# Patient Record
Sex: Female | Born: 1985 | Race: Asian | Hispanic: No | Marital: Married | Smoking: Never smoker
Health system: Southern US, Community
[De-identification: ages and names within clinical notes are randomized; demographics above are authoritative.]

## PROBLEM LIST (undated history)

## (undated) DIAGNOSIS — D582 Other hemoglobinopathies: Secondary | ICD-10-CM

## (undated) DIAGNOSIS — Z8742 Personal history of other diseases of the female genital tract: Secondary | ICD-10-CM

## (undated) DIAGNOSIS — R200 Anesthesia of skin: Secondary | ICD-10-CM

## (undated) DIAGNOSIS — Z8739 Personal history of other diseases of the musculoskeletal system and connective tissue: Secondary | ICD-10-CM

## (undated) DIAGNOSIS — L900 Lichen sclerosus et atrophicus: Secondary | ICD-10-CM

## (undated) HISTORY — PX: NO PAST SURGERIES: SHX2092

## (undated) HISTORY — DX: Other hemoglobinopathies: D58.2

## (undated) HISTORY — PX: WISDOM TOOTH EXTRACTION: SHX21

## (undated) HISTORY — DX: Personal history of other diseases of the female genital tract: Z87.42

## (undated) HISTORY — DX: Lichen sclerosus et atrophicus: L90.0

---

## 2006-01-20 ENCOUNTER — Emergency Department (HOSPITAL_COMMUNITY): Admission: EM | Admit: 2006-01-20 | Discharge: 2006-01-20 | Payer: Self-pay | Admitting: Emergency Medicine

## 2007-11-01 ENCOUNTER — Emergency Department (HOSPITAL_COMMUNITY): Admission: EM | Admit: 2007-11-01 | Discharge: 2007-11-01 | Payer: Self-pay | Admitting: Emergency Medicine

## 2007-11-03 ENCOUNTER — Emergency Department (HOSPITAL_COMMUNITY): Admission: EM | Admit: 2007-11-03 | Discharge: 2007-11-03 | Payer: Self-pay | Admitting: Emergency Medicine

## 2007-11-28 ENCOUNTER — Ambulatory Visit: Payer: Self-pay | Admitting: Gynecology

## 2007-12-08 ENCOUNTER — Ambulatory Visit: Payer: Self-pay | Admitting: Gynecology

## 2008-01-18 ENCOUNTER — Emergency Department (HOSPITAL_COMMUNITY): Admission: EM | Admit: 2008-01-18 | Discharge: 2008-01-18 | Payer: Self-pay | Admitting: Family Medicine

## 2010-04-11 ENCOUNTER — Emergency Department (HOSPITAL_COMMUNITY)
Admission: EM | Admit: 2010-04-11 | Discharge: 2010-04-12 | Disposition: A | Discharge status: Home / Self Care | Payer: BC Managed Care – PPO | Attending: Emergency Medicine | Admitting: Emergency Medicine

## 2010-04-11 DIAGNOSIS — N83209 Unspecified ovarian cyst, unspecified side: Secondary | ICD-10-CM | POA: Insufficient documentation

## 2010-04-11 DIAGNOSIS — R1031 Right lower quadrant pain: Secondary | ICD-10-CM | POA: Insufficient documentation

## 2010-04-11 DIAGNOSIS — D72829 Elevated white blood cell count, unspecified: Secondary | ICD-10-CM | POA: Insufficient documentation

## 2010-04-11 LAB — CBC
Hemoglobin: 10.9 g/dL — ABNORMAL LOW (ref 12.0–15.0)
MCHC: 33.4 g/dL (ref 30.0–36.0)
MCV: 61.7 fL — ABNORMAL LOW (ref 78.0–100.0)
RDW: 15 % (ref 11.5–15.5)

## 2010-04-12 ENCOUNTER — Emergency Department (HOSPITAL_COMMUNITY): Payer: BC Managed Care – PPO

## 2010-04-12 ENCOUNTER — Encounter (HOSPITAL_COMMUNITY): Payer: Self-pay

## 2010-04-12 LAB — BASIC METABOLIC PANEL
Chloride: 102 mEq/L (ref 96–112)
GFR calc Af Amer: >60 mL/min (ref >60)
Glucose, Bld: 96 mg/dL (ref 70–99)
Sodium: 136 mEq/L (ref 135–145)

## 2010-04-12 LAB — WET PREP, GENITAL
Clue Cells Wet Prep HPF POC: NONE SEEN
Trich, Wet Prep: NONE SEEN
Yeast Wet Prep HPF POC: NONE SEEN

## 2010-04-12 LAB — DIFFERENTIAL
Basophils Absolute: 0 10*3/uL (ref 0.0–0.1)
Eosinophils Relative: 0 % (ref 0–5)
Monocytes Relative: 9 % (ref 3–12)

## 2010-04-12 LAB — URINALYSIS, ROUTINE W REFLEX MICROSCOPIC
Bilirubin Urine: NEGATIVE
Ketones, ur: 15 mg/dL — AB
Nitrite: NEGATIVE
Protein, ur: NEGATIVE mg/dL
Specific Gravity, Urine: 1.02 (ref 1.005–1.030)
pH: 6 (ref 5.0–8.0)

## 2010-04-12 LAB — GC/CHLAMYDIA PROBE AMP, GENITAL
Chlamydia, DNA Probe: NEGATIVE
GC Probe Amp, Genital: NEGATIVE

## 2010-04-12 LAB — POCT PREGNANCY, URINE: Preg Test, Ur: NEGATIVE

## 2010-04-12 MED ORDER — IOHEXOL 300 MG/ML  SOLN
100.0000 mL | Freq: Once | INTRAMUSCULAR | Status: AC | PRN
  Administered 2010-04-12: 100 mL via INTRAVENOUS

## 2010-04-13 LAB — URINE CULTURE: Culture  Setup Time: 201202220420

## 2010-10-30 LAB — HIV ANTIBODY (ROUTINE TESTING W REFLEX): HIV: NONREACTIVE

## 2010-10-30 LAB — GC/CHLAMYDIA PROBE AMP, GENITAL
Chlamydia: NEGATIVE
Gonorrhea: NEGATIVE

## 2010-10-30 LAB — RPR: RPR: NONREACTIVE

## 2010-10-30 LAB — ANTIBODY SCREEN: Antibody Screen: NEGATIVE

## 2010-10-30 LAB — HEPATITIS B SURFACE ANTIGEN: Hepatitis B Surface Ag: NEGATIVE

## 2010-11-22 LAB — POCT URINALYSIS DIP (DEVICE)
Bilirubin Urine: NEGATIVE
Glucose, UA: NEGATIVE
Protein, ur: 30 — AB
Protein, ur: NEGATIVE
Specific Gravity, Urine: 1.015
Urobilinogen, UA: 0.2

## 2010-11-22 LAB — URINE CULTURE: Colony Count: >=100000

## 2010-11-22 LAB — POCT PREGNANCY, URINE: Preg Test, Ur: NEGATIVE

## 2011-02-20 NOTE — L&D Delivery Note (Signed)
Operative Delivery Note   Patient pushed over two hours and became exhausted.  Requested assistance and we discussed vacuum assisted delivery in detail as below.  Kiwi vacuum applied to a +3 station with a good amount of head visible at introitus with pushing.  In the green zone, head pulled to crowning with no pop-offs in 3 pulls.  Mother then pushed out remainder of baby. At 6:24 AM a healthy female was delivered via Vaginal, Vacuum Investment banker, operational).  Presentation: vertex; Position: Left,, Occiput,, Anterior; Station: +3.  Verbal consent: obtained from patient.  Risks and benefits discussed in detail.  Risks include, but are not limited to the risks of anesthesia, bleeding, infection, damage to maternal tissues, fetal cephalhematoma.  There is also the risk of inability to effect vaginal delivery of the head, or shoulder dystocia that cannot be resolved by established maneuvers, leading to the need for emergency cesarean section.  APGAR: 8, 8; weight 7 lb 6.3 oz (3355 g).   Placenta status: Intact, Spontaneous.   Cord: 3 vessels with the following complications: None.   Anesthesia: Epidural  Instruments: Kiwi vacuum Episiotomy: None Lacerations: 2nd degree Suture Repair: 3.0 vicryl rapide and 2-0 vicryl to reinforce sphincter Est. Blood Loss (mL): 450 cc  Mom to postpartum.  Baby to nursery-stable.  Oliver Pila 05/18/2011, 7:16 AM

## 2011-05-15 ENCOUNTER — Institutional Professional Consult (permissible substitution): Payer: BC Managed Care – PPO | Admitting: Pediatrics

## 2011-05-16 ENCOUNTER — Telehealth (HOSPITAL_COMMUNITY): Payer: Self-pay | Admitting: *Deleted

## 2011-05-16 ENCOUNTER — Encounter (HOSPITAL_COMMUNITY): Payer: Self-pay | Admitting: *Deleted

## 2011-05-16 NOTE — Telephone Encounter (Signed)
Preadmission screen  

## 2011-05-17 ENCOUNTER — Encounter (HOSPITAL_COMMUNITY): Payer: Self-pay

## 2011-05-17 ENCOUNTER — Inpatient Hospital Stay (HOSPITAL_COMMUNITY)
Admission: AD | Admit: 2011-05-17 | Discharge: 2011-05-20 | DRG: 775 | Disposition: A | Discharge status: Home / Self Care | Payer: Self-pay | Source: Ambulatory Visit | Attending: Obstetrics and Gynecology | Admitting: Obstetrics and Gynecology

## 2011-05-17 DIAGNOSIS — O4290 Premature rupture of membranes, unspecified as to length of time between rupture and onset of labor, unspecified weeks of gestation: Secondary | ICD-10-CM | POA: Diagnosis present

## 2011-05-17 LAB — CBC
MCV: 63.2 fL — ABNORMAL LOW (ref 78.0–100.0)
Platelets: 356 10*3/uL (ref 150–400)
RDW: 14.6 % (ref 11.5–15.5)
WBC: 10 10*3/uL (ref 4.0–10.5)

## 2011-05-17 LAB — POCT FERN TEST: Fern Test: POSITIVE

## 2011-05-17 MED ORDER — LACTATED RINGERS IV SOLN
500.0000 mL | INTRAVENOUS | Status: DC | PRN
  Administered 2011-05-18: 500 mL via INTRAVENOUS
  Administered 2011-05-18: 999 mL via INTRAVENOUS

## 2011-05-17 MED ORDER — ONDANSETRON HCL 4 MG/2ML IJ SOLN: 4.0000 mg | Freq: Four times a day (QID) | INTRAMUSCULAR | Status: DC | PRN

## 2011-05-17 MED ORDER — OXYTOCIN 20 UNITS IN LACTATED RINGERS INFUSION - SIMPLE
125.0000 mL/h | Freq: Once | INTRAVENOUS | Status: AC
  Administered 2011-05-18: 999 mL/h via INTRAVENOUS

## 2011-05-17 MED ORDER — OXYTOCIN 20 UNITS IN LACTATED RINGERS INFUSION - SIMPLE
1.0000 m[IU]/min | INTRAVENOUS | Status: DC
  Administered 2011-05-18: 2 m[IU]/min via INTRAVENOUS
  Filled 2011-05-17: qty 1000

## 2011-05-17 MED ORDER — LACTATED RINGERS IV SOLN
INTRAVENOUS | Status: DC
  Administered 2011-05-17: 20:00:00 via INTRAVENOUS

## 2011-05-17 MED ORDER — FLEET ENEMA 7-19 GM/118ML RE ENEM: 1.0000 | ENEMA | RECTAL | Status: DC | PRN

## 2011-05-17 MED ORDER — OXYTOCIN 20 UNITS IN LACTATED RINGERS INFUSION - SIMPLE: 1.0000 m[IU]/min | INTRAVENOUS | Status: DC

## 2011-05-17 MED ORDER — IBUPROFEN 600 MG PO TABS: 600.0000 mg | ORAL_TABLET | Freq: Four times a day (QID) | ORAL | Status: DC | PRN

## 2011-05-17 MED ORDER — LIDOCAINE HCL (PF) 1 % IJ SOLN
30.0000 mL | INTRAMUSCULAR | Status: DC | PRN
  Administered 2011-05-18: 30 mL via SUBCUTANEOUS
  Filled 2011-05-17: qty 30

## 2011-05-17 MED ORDER — TERBUTALINE SULFATE 1 MG/ML IJ SOLN: 0.2500 mg | Freq: Once | INTRAMUSCULAR | Status: AC | PRN

## 2011-05-17 MED ORDER — CITRIC ACID-SODIUM CITRATE 334-500 MG/5ML PO SOLN: 30.0000 mL | ORAL | Status: DC | PRN

## 2011-05-17 MED ORDER — OXYCODONE-ACETAMINOPHEN 5-325 MG PO TABS: 1.0000 | ORAL_TABLET | ORAL | Status: DC | PRN

## 2011-05-17 MED ORDER — ACETAMINOPHEN 325 MG PO TABS: 650.0000 mg | ORAL_TABLET | ORAL | Status: DC | PRN

## 2011-05-17 MED ORDER — OXYTOCIN BOLUS FROM INFUSION
500.0000 mL | Freq: Once | INTRAVENOUS | Status: DC
  Filled 2011-05-17: qty 500

## 2011-05-17 NOTE — H&P (Signed)
Sheila Schneider is a 26 y.o. female G1P0 at 39+ weeks (EDD 05/20/11 by LMP c/w 8 week [place])  presenting with ROM about 5pm and contractions every 10 minutes.  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    Maternal Medical History:  Reason for admission: Reason for admission: rupture of membranes.  Contractions: Onset was 3-5 hours ago.   Frequency: irregular.   Duration is approximately 10 minutes.   Perceived severity is mild.    Fetal activity: Perceived fetal activity is normal.      OB History    Grav Para Term Preterm Abortions TAB SAB Ect Mult Living   1 0 0 0 0 0 0 0 0 0      Past Medical History  Diagnosis Date  . Lichen sclerosus     of vulva  . History of ovarian cyst    Past Surgical History  Procedure Date  . Wisdom tooth extraction    Family History: family history is negative for Anesthesia problems. Social History:  reports that she has never smoked. She has never used smokeless tobacco. She reports that she does not drink alcohol or use illicit drugs.  Review of Systems  Constitutional: Negative for fever.    Dilation: 2.5 Effacement (%): 70 Station: -2 Exam by:: dr Senaida Ores No forebag felt Blood pressure 117/73, pulse 82, temperature 97.9 F (36.6 C), temperature source Oral, resp. rate 18, height 5\' 3"  (1.6 m), weight 56.246 kg (124 lb), last menstrual period 08/13/2010. Maternal Exam:  Uterine Assessment: Contraction strength is mild.  Contraction duration is 10 minutes. Contraction frequency is regular.   Abdomen: Patient reports no abdominal tenderness. Fetal presentation: vertex  Introitus: Normal vulva. Normal vagina.    Physical Exam  Constitutional: She is oriented to person, place, and time. She appears well-developed and well-nourished.  Cardiovascular: Normal rate and regular rhythm.   Respiratory: Effort normal and breath sounds normal.  GI: Soft. Bowel sounds are normal.    Genitourinary: Vagina normal and uterus normal.  Neurological: She is alert and oriented to person, place, and time.  Psychiatric: She has a normal mood and affect. Her behavior is normal.    Prenatal labs: ABO, Rh: AB/Positive/-- (09/10 0000) Antibody: Negative (09/10 0000) Rubella: Immune (09/10 0000) RPR: Nonreactive (09/10 0000)  HBsAg: Negative (09/10 0000)  HIV: Non-reactive (09/10 0000)  GBS: Negative (03/05 0000)  First trimester screen WNL AFP WNL  One hour GTT 108  Assessment/Plan: Long d/w pt re: SROM and need to augment labor with pitocin if does not begin in a reasonable amount of time to decrease risk of infection.  Patient would like to wait awhile before beginning pitocin.  FHR currently reactive and patient has no fever.   D/w pt rechecking cervix in 2-3 hours and if no change or increase in contractions, I would recommend pitocin.  She states she will consider this.  Unsure if wants epidural, will see how it goes.  Oliver Pila 05/17/2011, 8:40 PM

## 2011-05-17 NOTE — MAU Note (Signed)
Taking a nap, woke up with small gushes of fluid.  ( first noted 1655).  Fluid is still coming, clear water/mucous mix.

## 2011-05-18 ENCOUNTER — Inpatient Hospital Stay (HOSPITAL_COMMUNITY): Payer: Self-pay | Admitting: Anesthesiology

## 2011-05-18 ENCOUNTER — Encounter (HOSPITAL_COMMUNITY): Payer: Self-pay | Admitting: Anesthesiology

## 2011-05-18 ENCOUNTER — Encounter (HOSPITAL_COMMUNITY): Payer: Self-pay | Admitting: *Deleted

## 2011-05-18 MED ORDER — DIPHENHYDRAMINE HCL 50 MG/ML IJ SOLN: 12.5000 mg | INTRAMUSCULAR | Status: DC | PRN

## 2011-05-18 MED ORDER — PHENYLEPHRINE 40 MCG/ML (10ML) SYRINGE FOR IV PUSH (FOR BLOOD PRESSURE SUPPORT)
80.0000 ug | PREFILLED_SYRINGE | INTRAVENOUS | Status: DC | PRN
  Filled 2011-05-18: qty 5

## 2011-05-18 MED ORDER — SIMETHICONE 80 MG PO CHEW: 80.0000 mg | CHEWABLE_TABLET | ORAL | Status: DC | PRN

## 2011-05-18 MED ORDER — DIPHENHYDRAMINE HCL 25 MG PO CAPS: 25.0000 mg | ORAL_CAPSULE | Freq: Four times a day (QID) | ORAL | Status: DC | PRN

## 2011-05-18 MED ORDER — EPHEDRINE 5 MG/ML INJ
10.0000 mg | INTRAVENOUS | Status: DC | PRN
  Filled 2011-05-18: qty 4

## 2011-05-18 MED ORDER — PHENYLEPHRINE 40 MCG/ML (10ML) SYRINGE FOR IV PUSH (FOR BLOOD PRESSURE SUPPORT): 80.0000 ug | PREFILLED_SYRINGE | INTRAVENOUS | Status: DC | PRN

## 2011-05-18 MED ORDER — IBUPROFEN 600 MG PO TABS
600.0000 mg | ORAL_TABLET | Freq: Four times a day (QID) | ORAL | Status: DC
  Administered 2011-05-18 – 2011-05-20 (×4): 600 mg via ORAL
  Filled 2011-05-18 (×7): qty 1

## 2011-05-18 MED ORDER — LANOLIN HYDROUS EX OINT: TOPICAL_OINTMENT | CUTANEOUS | Status: DC | PRN

## 2011-05-18 MED ORDER — BENZOCAINE-MENTHOL 20-0.5 % EX AERO: 1.0000 "application " | INHALATION_SPRAY | CUTANEOUS | Status: DC | PRN

## 2011-05-18 MED ORDER — FENTANYL 2.5 MCG/ML BUPIVACAINE 1/10 % EPIDURAL INFUSION (WH - ANES)
14.0000 mL/h | INTRAMUSCULAR | Status: DC
  Administered 2011-05-18: 14 mL/h via EPIDURAL
  Filled 2011-05-18: qty 60

## 2011-05-18 MED ORDER — ZOLPIDEM TARTRATE 5 MG PO TABS: 5.0000 mg | ORAL_TABLET | Freq: Every evening | ORAL | Status: DC | PRN

## 2011-05-18 MED ORDER — EPHEDRINE 5 MG/ML INJ: 10.0000 mg | INTRAVENOUS | Status: DC | PRN

## 2011-05-18 MED ORDER — ONDANSETRON HCL 4 MG/2ML IJ SOLN: 4.0000 mg | INTRAMUSCULAR | Status: DC | PRN

## 2011-05-18 MED ORDER — DIBUCAINE 1 % RE OINT
1.0000 "application " | TOPICAL_OINTMENT | RECTAL | Status: DC | PRN
  Administered 2011-05-19: 1 via RECTAL
  Filled 2011-05-18: qty 28

## 2011-05-18 MED ORDER — WITCH HAZEL-GLYCERIN EX PADS
1.0000 "application " | MEDICATED_PAD | CUTANEOUS | Status: DC | PRN
  Administered 2011-05-19: 1 via TOPICAL

## 2011-05-18 MED ORDER — ONDANSETRON HCL 4 MG PO TABS: 4.0000 mg | ORAL_TABLET | ORAL | Status: DC | PRN

## 2011-05-18 MED ORDER — SENNOSIDES-DOCUSATE SODIUM 8.6-50 MG PO TABS
2.0000 | ORAL_TABLET | Freq: Every day | ORAL | Status: DC
  Administered 2011-05-18 – 2011-05-19 (×2): 2 via ORAL

## 2011-05-18 MED ORDER — PRENATAL MULTIVITAMIN CH
1.0000 | ORAL_TABLET | Freq: Every day | ORAL | Status: DC
  Administered 2011-05-18 – 2011-05-20 (×3): 1 via ORAL
  Filled 2011-05-18 (×3): qty 1

## 2011-05-18 MED ORDER — TETANUS-DIPHTH-ACELL PERTUSSIS 5-2.5-18.5 LF-MCG/0.5 IM SUSP
0.5000 mL | Freq: Once | INTRAMUSCULAR | Status: AC
  Administered 2011-05-19: 0.5 mL via INTRAMUSCULAR
  Filled 2011-05-18: qty 0.5

## 2011-05-18 MED ORDER — LACTATED RINGERS IV SOLN: 500.0000 mL | Freq: Once | INTRAVENOUS | Status: DC

## 2011-05-18 MED ORDER — LIDOCAINE HCL (PF) 1 % IJ SOLN
INTRAMUSCULAR | Status: DC | PRN
  Administered 2011-05-18 (×2): 5 mL

## 2011-05-18 MED ORDER — OXYCODONE-ACETAMINOPHEN 5-325 MG PO TABS: 1.0000 | ORAL_TABLET | ORAL | Status: DC | PRN

## 2011-05-18 NOTE — Progress Notes (Signed)
Patient ID: Sheila Schneider, female   DOB: 06-14-1985, 27 y.o.   MRN: [id number] Pt eventually agreed to pitocin and after 4mu went into active labor, received an epidural and after epidural found to be 8-9cm and feeling pressure.  Desired to push with the pressure. FHR overall reassuring, has had some variables with pushing.  Making good progress, pushing about 1 hour and vertex has moved from 0/+1 to +2 station.  Will continue to push.

## 2011-05-18 NOTE — Progress Notes (Signed)
UR Chart review completed.  

## 2011-05-18 NOTE — Progress Notes (Signed)
Delivery of a live viable female at [phone]. Apgars 8,8.

## 2011-05-18 NOTE — Anesthesia Procedure Notes (Signed)
Epidural Patient location during procedure: OB Start time: 05/18/2011 2:34 AM  Staffing Anesthesiologist: Brayton Caves R Performed by: anesthesiologist   Preanesthetic Checklist Completed: patient identified, site marked, surgical consent, pre-op evaluation, timeout performed, IV checked, risks and benefits discussed and monitors and equipment checked  Epidural Patient position: sitting Prep: site prepped and draped and DuraPrep Patient monitoring: continuous pulse ox and blood pressure Approach: midline Injection technique: LOR air and LOR saline  Needle:  Needle type: Tuohy  Needle gauge: 17 G Needle length: 9 cm Needle insertion depth: 5 cm cm Catheter type: closed end flexible Catheter size: 19 Gauge Catheter at skin depth: 10 cm Test dose: negative  Assessment Events: blood not aspirated, injection not painful, no injection resistance, negative IV test and no paresthesia  Additional Notes Patient identified.  Risk benefits discussed including failed block, incomplete pain control, headache, nerve damage, paralysis, blood pressure changes, nausea, vomiting, reactions to medication both toxic or allergic, and postpartum back pain.  Patient expressed understanding and wished to proceed.  All questions were answered.  Sterile technique used throughout procedure and epidural site dressed with sterile barrier dressing. No paresthesia or other complications noted.The patient did not experience any signs of intravascular injection such as tinnitus or metallic taste in mouth nor signs of intrathecal spread such as rapid motor block. Please see nursing notes for vital signs.

## 2011-05-18 NOTE — MAU Note (Signed)
Reported off to [facility].

## 2011-05-18 NOTE — MAU Note (Signed)
Report received.  Elllen from nursery in, baby at breast.  Hug tag explained and in place.  Infant care explained by Alvino Chapel, nursery nurse.  Family at bedside supportive and helpful.  Pt in sidelying position to feed, too uncomfortable to sit up.

## 2011-05-19 LAB — CBC
Platelets: 294 10*3/uL (ref 150–400)
RDW: 14.5 % (ref 11.5–15.5)
WBC: 15.1 10*3/uL — ABNORMAL HIGH (ref 4.0–10.5)

## 2011-05-19 MED ORDER — BENZOCAINE-MENTHOL 20-0.5 % EX AERO
INHALATION_SPRAY | CUTANEOUS | Status: AC
  Administered 2011-05-19: 19:00:00
  Filled 2011-05-19: qty 56

## 2011-05-19 NOTE — Progress Notes (Signed)
Patient ID: Sheila Schneider, female   DOB: [birth year], 26 y.o.   MRN: [id number] #[id number] afebrile BP normal Desires d/c but baby needs ECHO so they will stay until tomorrow.

## 2011-05-19 NOTE — Anesthesia Postprocedure Evaluation (Signed)
Anesthesia Post Note  Patient: Sheila Schneider  Procedure(s) Performed: * No procedures listed *  Anesthesia type: Epidural  Patient location: Mother/Baby  Post pain: Pain level controlled  Post assessment: Post-op Vital signs reviewed  Last Vitals:  Filed Vitals:   05/19/11 0550  BP: 101/54  Pulse: 83  Temp: 36.5 C  Resp:     Post vital signs: Reviewed  Level of consciousness: awake  Complications: No apparent anesthesia complications

## 2011-05-20 NOTE — Progress Notes (Signed)
Patient ID: Sheila Schneider, female   DOB: 1985-12-20, 26 y.o.   MRN: [id number] #[id number] afebrile BP normal ECHO of the baby's heart showed patent foramen ovale and a large PDA

## 2011-05-20 NOTE — Anesthesia Preprocedure Evaluation (Signed)

## 2011-05-20 NOTE — Progress Notes (Signed)
Discharge instructions reviewed with patient and significant other, they verbalized understanding.  [facility] Pamphlet given to patient.

## 2011-05-20 NOTE — Discharge Summary (Signed)
Sheila Schneider, Sheila Schneider               ACCOUNT NO.:  [id number]  MEDICAL RECORD NO.:  [id number]  LOCATION:  9304                          FACILITY:  [facility]  PHYSICIAN:  Malachi Pro. Ambrose Mantle, M.D. DATE OF BIRTH:  January 07, 1986  DATE OF ADMISSION:  05/18/2011 DATE OF DISCHARGE:  05/20/2011                              DISCHARGE SUMMARY   This is a 26 year old Asian female, para 0, gravida 1, at 39+ weeks' gestation, [facility] May 20, 2011, by last period compatible with an 8-week ultrasound, presented with rupture of membranes and sporadic contractions.  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  The patient had begun contracting 3-5 hours prior to admission with rupture of membranes.  PAST MEDICAL HISTORY:  Lichen sclerosus of the vulva, history of ovarian cyst.  SURGICAL HISTORY:  Wisdom teeth extraction.  FAMILY HISTORY:  No anesthesia problems.  SOCIAL HISTORY:  No smoking, no alcohol or illicit drugs.  On admission, the patient's cervix was 2.5 cm, 70% vertex at -2.  Vital signs were normal.  Blood group and type AB positive, negative antibody, rubella immune, RPR nonreactive, hepatitis B surface antigen negative, HIV negative, GBS negative, first trimester screen normal, AFP normal, 1- hour Glucola 108.  The patient requested waiting for 2-3 hours prior to starting Pitocin.  She eventually agreed to Pitocin and after 4 mU/minute, went into active labor.  She received an epidural and after the epidural was found to be 8-9 cm dilated and feeling pressure.  She made good progress pushing about 1 hour, vertex move to a +2 station. After 2 hours of pushing, the patient became exhausted.  She requested assistance.  Dr. Senaida Ores discussed vacuum-assisted delivery in detail.  Kiwi vacuum was applied at a +3 station with a good amount of head visible at the introitus with pushing.  The vacuum was in the green zone.  Head was pulled, the crowning with no  pop-offs and 3 pulls. Mother then pushed out the remainder of the baby at 8:24 a.m.  A healthy female was delivered vaginally.  The weight was 7 pounds 6.3 ounces. Apgars were 8 and 8 at 1 and 5 minutes.  Second-degree laceration was repaired with 3-0 and 2-0 Vicryl.  Blood loss about 450 mL.  Postpartum, the patient did well and was discharged on the second postpartum day. The baby had an echocardiogram on the first day of life and showed large patent ductus arteriosus and a patent foramen ovale.  Dr. Maple Hudson advised no circumcision because there was some type of wave in the area of the penis and scrotum.  On the second postpartum day, the patient is afebrile.  Other vital signs are normal.  She is ready for discharge. Initial hemoglobin 10.4, hematocrit 30.7, white count 10,000, platelet count 356,000.  Followup hemoglobin was 8.3.  RPR nonreactive.  FINAL DIAGNOSES:  Intrauterine pregnancy, 39 weeks and 6 days, delivered vertex, premature rupture of the membranes, prolonged second stage of labor.  OPERATION:  Vacuum-assisted vaginal delivery, repair of second-degree midline laceration.  FINAL CONDITION:  Improved.  INSTRUCTIONS:  Include our regular discharge instruction booklet.  The patient declines analgesics at  discharge.  She is advised to continue her prenatal vitamins and take ferrous sulfate 325 mg twice daily. Return to the office in 6 weeks for followup examination.     Malachi Pro. Ambrose Mantle, M.D.     TFH/MEDQ  D:  05/20/2011  T:  05/20/2011  Job:  [id number]

## 2011-05-20 NOTE — Progress Notes (Signed)
Pt and infant d/c  home with family to private car. Infant placed in car seat. D/C instructions and prescriptions for pt and infant reviewed with pt. Pt verbalized understanding.

## 2011-05-20 NOTE — Discharge Instructions (Signed)
booklet °

## 2011-05-22 ENCOUNTER — Inpatient Hospital Stay (HOSPITAL_COMMUNITY): Admission: RE | Admit: 2011-05-22 | Payer: BC Managed Care – PPO | Source: Ambulatory Visit

## 2012-06-24 IMAGING — CT CT ABD-PELV W/ CM
2 of 4 series · 17 of 46 positions shown, 19 images · IV contrast (omnipaque)
Comparison: Pelvic ultrasound 04/12/2010.

CLINICAL DATA: Right lower quadrant abdominal pain.  Nausea and vomiting.
Elevated white count.  Decreased hemoglobin.

CT ABDOMEN AND PELVIS WITH CONTRAST
TECHNIQUE: Multidetector CT imaging of the abdomen and pelvis was
performed following the standard protocol during bolus
administration of intravenous contrast.
Contrast: 100 ml Omnipaque 300

[Series 2: rtn ap with st · axial · 0.59mm/px · z∈[+890,+1235]mm · 14 of 75 slices shown, 16 images]
[im 3/75  soft-tissue]
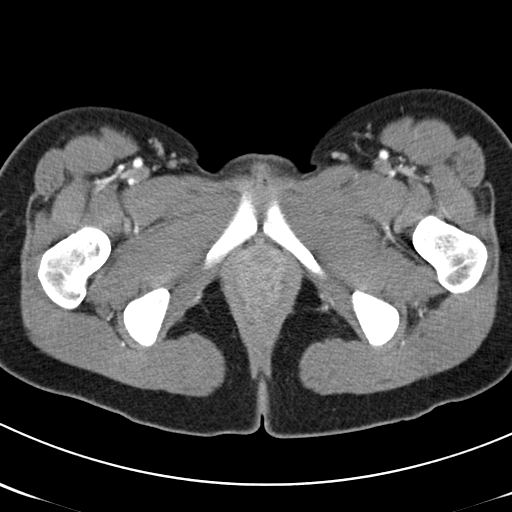
[im 3/75  bone]
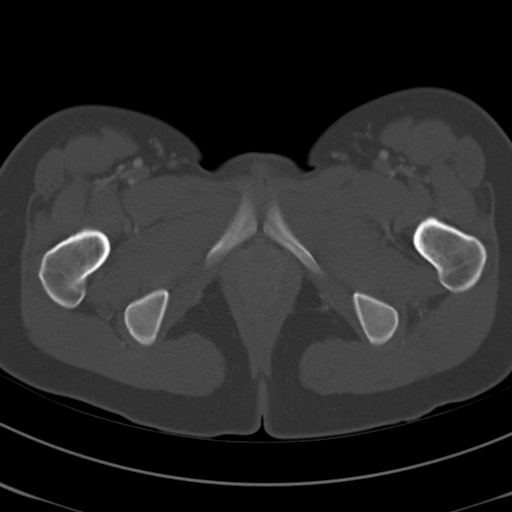
[im 9/75  soft-tissue]
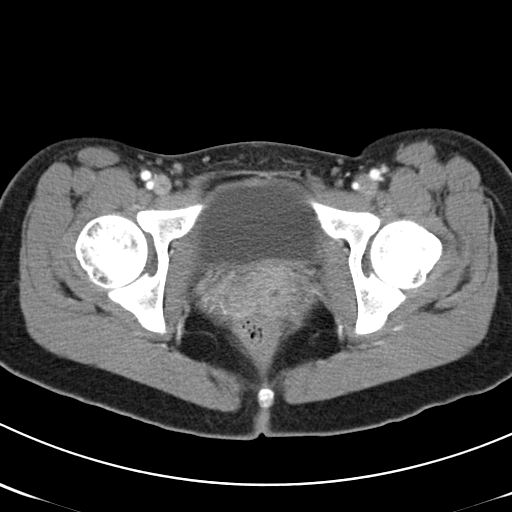
[im 15/75  soft-tissue]
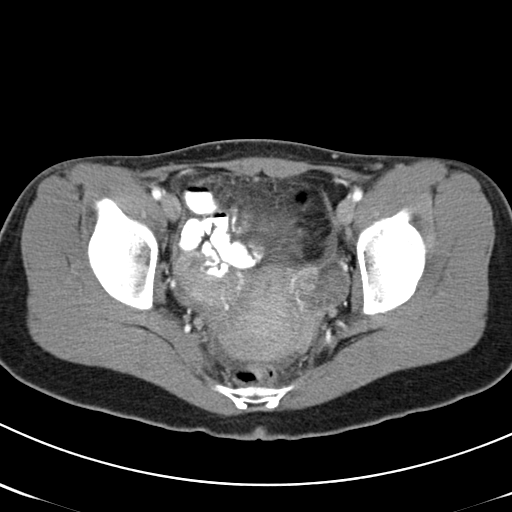
[im 20/75  soft-tissue]
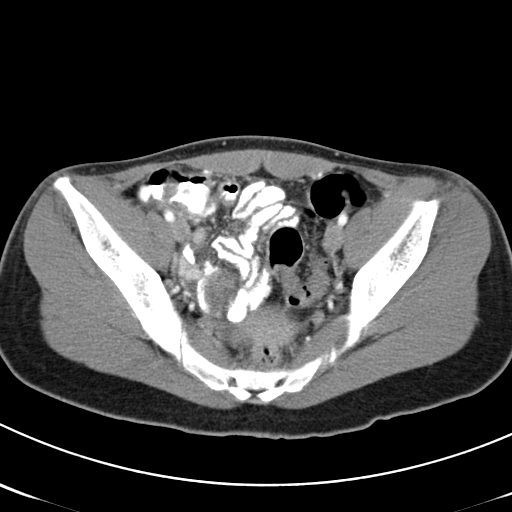
[im 26/75  soft-tissue]
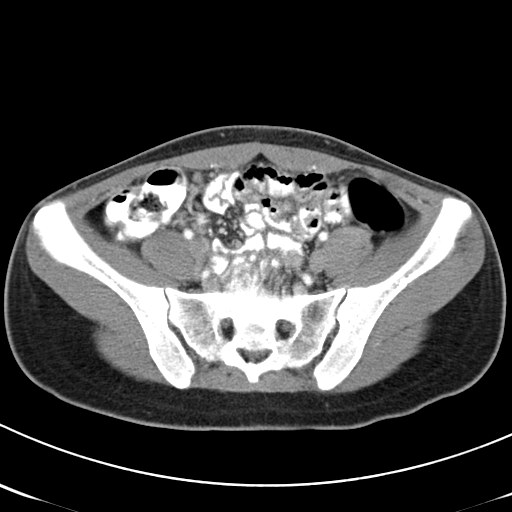
[im 29/75  soft-tissue]
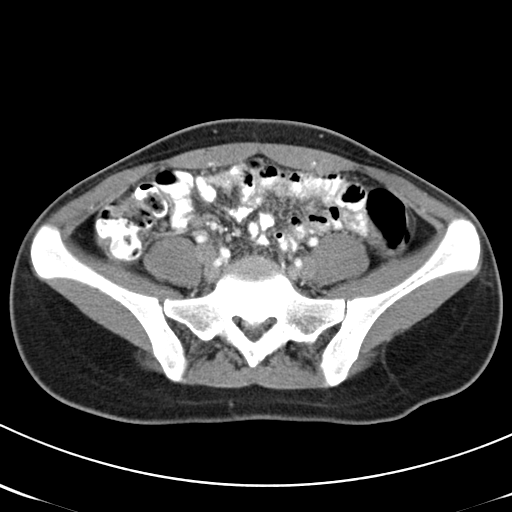
[im 35/75  soft-tissue]
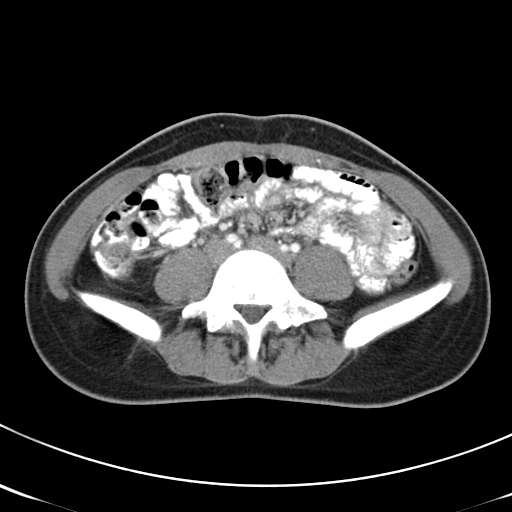
[im 40/75  soft-tissue]
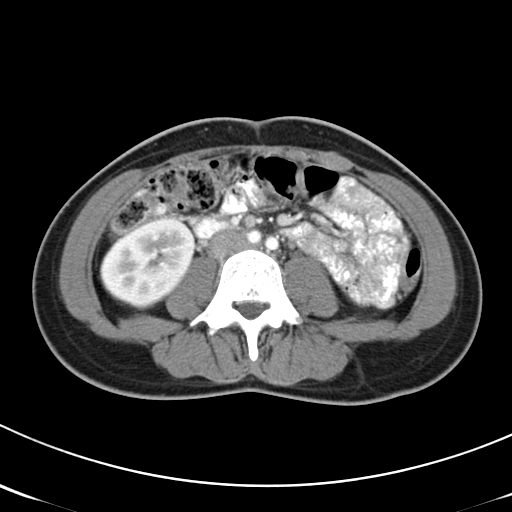
[im 46/75  soft-tissue]
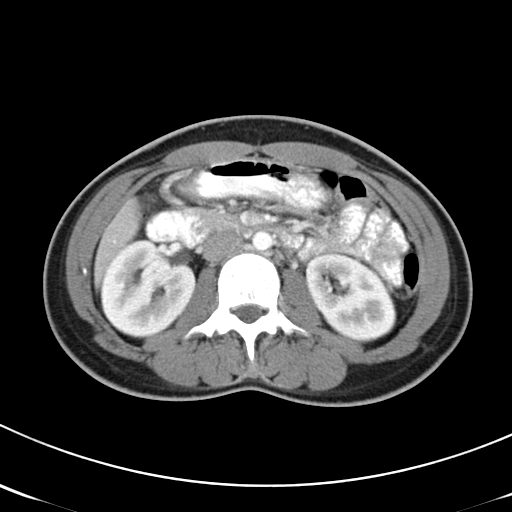
[im 46/75  bone]
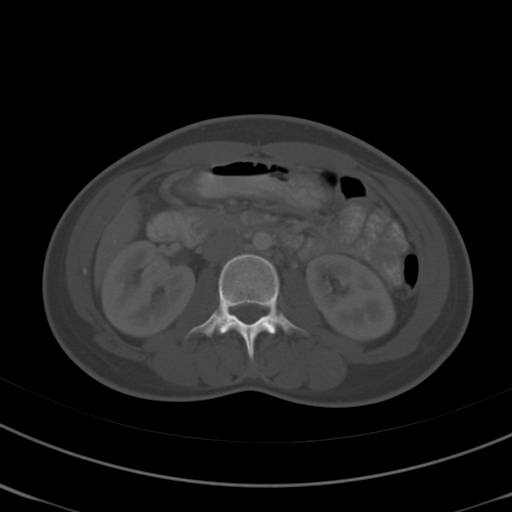
[im 49/75  soft-tissue]
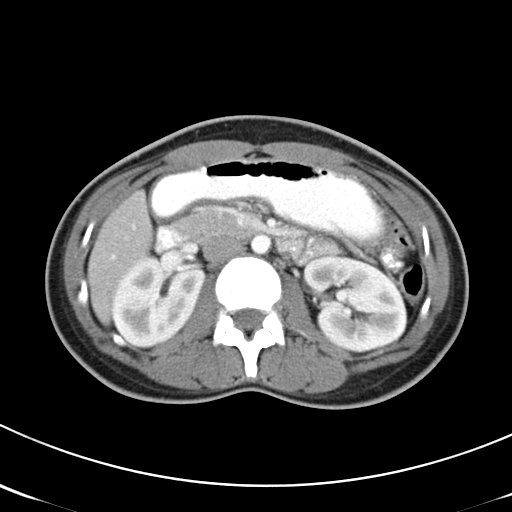
[im 55/75  soft-tissue]
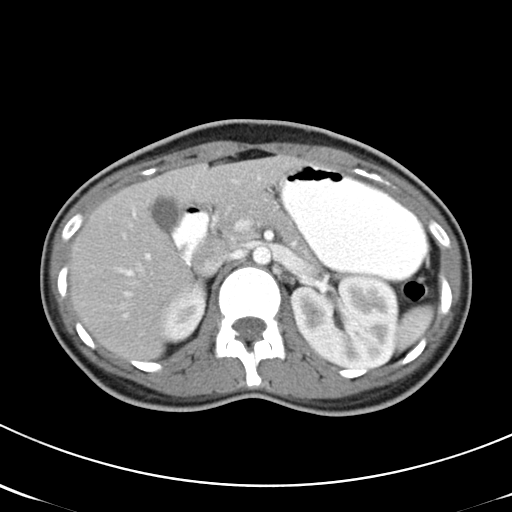
[im 60/75  soft-tissue]
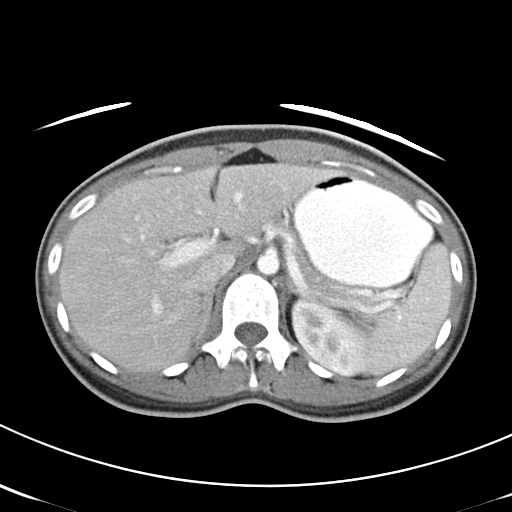
[im 66/75  soft-tissue]
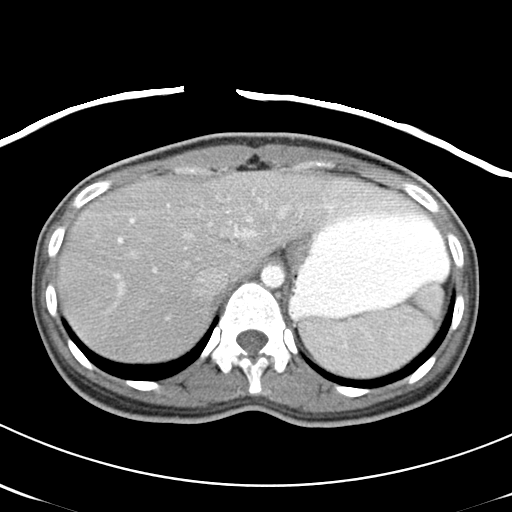
[im 72/75  soft-tissue]
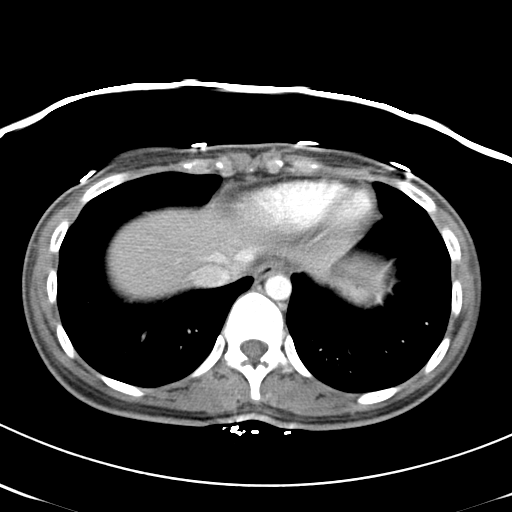

[Series 602: <mpr thick range> · coronal · 0.76mm/px · 3 of 55 slices shown]
[im 19/55  soft-tissue]
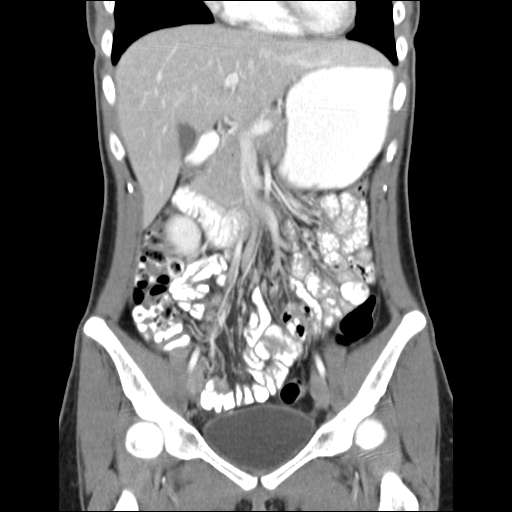
[im 25/55  soft-tissue]
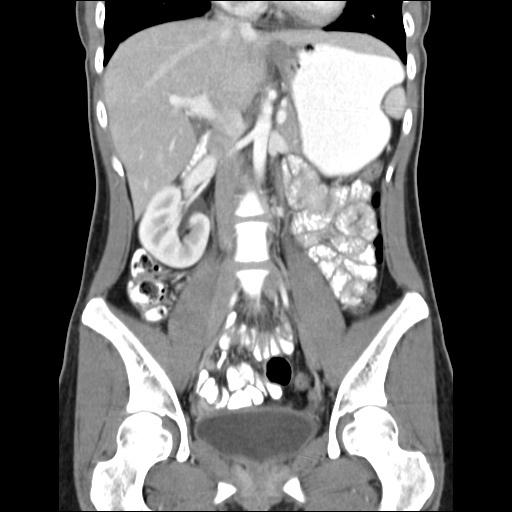
[im 31/55  soft-tissue]
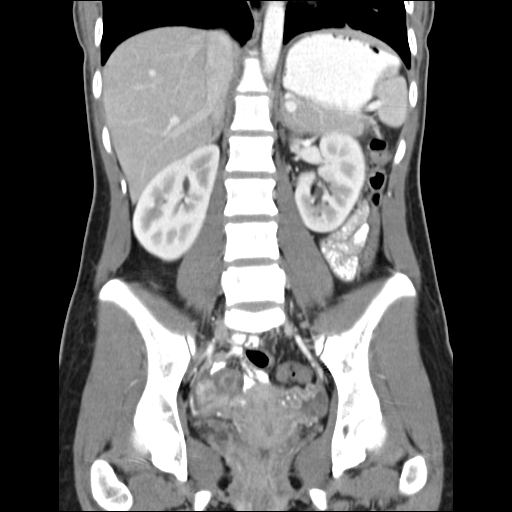

[17 of 46 positions shown; findings below may reference images not displayed]

FINDINGS: The lung bases are clear without focal nodule, mass, or
airspace disease.  The heart size is normal.  No significant
pleural or pericardial effusion is present.

The liver and spleen are within normal limits.  The stomach,
duodenum, and pancreas are unremarkable.  The common bile duct and
gallbladder are normal.  The adrenal glands are normal bilaterally.
The kidneys are unremarkable.

The rectosigmoid colon is within normal limits.  The remainder of
the colon is within normal limits.  The appendix appears to be
retrocecal  and is within normal limits.  There are small lymph
nodes within the ileocolic ligament.  Mild stranding is seen
diffusely within the small bowel mesentery.

The  uterus is retroflexed.  A 9 mm peripherally enhancing follicle
of the right ovary may reflect recent rupture.  A small amount of
free fluid is present.  This is likely physiologic.  No discrete
abscess is seen.

The bone windows are within normal limits.
IMPRESSION: 1.  No evidence for appendicitis.
2.  Peripherally enhancing follicle on the right ovary likely
represents recent rupture.  This could be a source of the patient's
pain.
3.  Fluid in the pelvis is likely physiologic.
4.  Mild stranding of the small bowel mesentery suggests
nonspecific inflammation.

## 2012-12-25 ENCOUNTER — Other Ambulatory Visit: Payer: Self-pay

## 2013-02-19 NOTE — L&D Delivery Note (Signed)
Delivery Note At 6:27 PM a viable female was delivered via Vaginal, Spontaneous Delivery (Presentation: Right Occiput Anterior).  APGAR: 9, 9; weight pending.   Placenta status: spontaneous, intact.  Cord: 3 vessels with the following complications: None.  Anesthesia: Epidural  Episiotomy: Median Lacerations:  None Suture Repair: 3.0 vicryl Est. Blood Loss (mL): 300  Mom to postpartum.  Baby to Couplet care / Skin to Skin.  Discussed circumcision procedure and risks, will do tomorrow am.  Terrisa Curfman D 11/05/2013, 6:45 PM

## 2013-04-15 LAB — OB RESULTS CONSOLE ANTIBODY SCREEN: Antibody Screen: NEGATIVE

## 2013-04-15 LAB — OB RESULTS CONSOLE GC/CHLAMYDIA
Chlamydia: NEGATIVE
Gonorrhea: NEGATIVE

## 2013-04-15 LAB — OB RESULTS CONSOLE ABO/RH: RH TYPE: POSITIVE

## 2013-04-15 LAB — OB RESULTS CONSOLE HEPATITIS B SURFACE ANTIGEN: HEP B S AG: NEGATIVE

## 2013-04-15 LAB — OB RESULTS CONSOLE RPR: RPR: NONREACTIVE

## 2013-04-15 LAB — OB RESULTS CONSOLE HIV ANTIBODY (ROUTINE TESTING): HIV: NONREACTIVE

## 2013-04-15 LAB — OB RESULTS CONSOLE RUBELLA ANTIBODY, IGM: RUBELLA: NON-IMMUNE/NOT IMMUNE

## 2013-10-09 LAB — OB RESULTS CONSOLE GBS: STREP GROUP B AG: NEGATIVE

## 2013-10-23 ENCOUNTER — Encounter (HOSPITAL_COMMUNITY): Payer: Self-pay | Admitting: *Deleted

## 2013-10-23 ENCOUNTER — Telehealth (HOSPITAL_COMMUNITY): Payer: Self-pay | Admitting: *Deleted

## 2013-10-23 NOTE — Telephone Encounter (Signed)
Preadmission screen  

## 2013-11-05 ENCOUNTER — Inpatient Hospital Stay (HOSPITAL_COMMUNITY): Payer: Medicaid Other | Admitting: Anesthesiology

## 2013-11-05 ENCOUNTER — Encounter (HOSPITAL_COMMUNITY): Payer: Self-pay | Admitting: *Deleted

## 2013-11-05 ENCOUNTER — Inpatient Hospital Stay (HOSPITAL_COMMUNITY)
Admission: AD | Admit: 2013-11-05 | Discharge: 2013-11-07 | DRG: 775 | Disposition: A | Discharge status: Home / Self Care | Payer: Medicaid Other | Source: Ambulatory Visit | Attending: Obstetrics and Gynecology | Admitting: Obstetrics and Gynecology

## 2013-11-05 ENCOUNTER — Encounter (HOSPITAL_COMMUNITY): Payer: Medicaid Other | Admitting: Anesthesiology

## 2013-11-05 DIAGNOSIS — O4290 Premature rupture of membranes, unspecified as to length of time between rupture and onset of labor, unspecified weeks of gestation: Secondary | ICD-10-CM | POA: Diagnosis present

## 2013-11-05 DIAGNOSIS — O99891 Other specified diseases and conditions complicating pregnancy: Secondary | ICD-10-CM | POA: Diagnosis present

## 2013-11-05 DIAGNOSIS — O42013 Preterm premature rupture of membranes, onset of labor within 24 hours of rupture, third trimester: Secondary | ICD-10-CM | POA: Diagnosis present

## 2013-11-05 LAB — CBC
HCT: 31.7 % — ABNORMAL LOW (ref 36.0–46.0)
Hemoglobin: 10.9 g/dL — ABNORMAL LOW (ref 12.0–15.0)
MCH: 22 pg — ABNORMAL LOW (ref 26.0–34.0)
MCHC: 34.4 g/dL (ref 30.0–36.0)
MCV: 64 fL — ABNORMAL LOW (ref 78.0–100.0)
Platelets: 333 10*3/uL (ref 150–400)
RBC: 4.95 MIL/uL (ref 3.87–5.11)
RDW: 14.9 % (ref 11.5–15.5)
WBC: 10 10*3/uL (ref 4.0–10.5)

## 2013-11-05 LAB — POCT FERN TEST: POCT FERN TEST: POSITIVE

## 2013-11-05 LAB — ABO/RH: ABO/RH(D): AB POS

## 2013-11-05 LAB — TYPE AND SCREEN
ABO/RH(D): AB POS
Antibody Screen: NEGATIVE

## 2013-11-05 LAB — RPR

## 2013-11-05 MED ORDER — METHYLERGONOVINE MALEATE 0.2 MG PO TABS: 0.2000 mg | ORAL_TABLET | ORAL | Status: DC | PRN

## 2013-11-05 MED ORDER — LIDOCAINE HCL (PF) 1 % IJ SOLN
30.0000 mL | INTRAMUSCULAR | Status: DC | PRN
Start: 2013-11-05 — End: 2013-11-05
  Filled 2013-11-05: qty 30

## 2013-11-05 MED ORDER — OXYTOCIN BOLUS FROM INFUSION
500.0000 mL | INTRAVENOUS | Status: DC
  Administered 2013-11-05: 500 mL via INTRAVENOUS

## 2013-11-05 MED ORDER — SENNOSIDES-DOCUSATE SODIUM 8.6-50 MG PO TABS
2.0000 | ORAL_TABLET | ORAL | Status: DC
  Administered 2013-11-06: 2 via ORAL
  Filled 2013-11-05 (×2): qty 2

## 2013-11-05 MED ORDER — PHENYLEPHRINE 40 MCG/ML (10ML) SYRINGE FOR IV PUSH (FOR BLOOD PRESSURE SUPPORT)
80.0000 ug | PREFILLED_SYRINGE | INTRAVENOUS | Status: DC | PRN
  Filled 2013-11-05: qty 2
  Filled 2013-11-05: qty 10

## 2013-11-05 MED ORDER — PHENYLEPHRINE 40 MCG/ML (10ML) SYRINGE FOR IV PUSH (FOR BLOOD PRESSURE SUPPORT)
80.0000 ug | PREFILLED_SYRINGE | INTRAVENOUS | Status: DC | PRN
  Filled 2013-11-05: qty 2

## 2013-11-05 MED ORDER — BUTORPHANOL TARTRATE 1 MG/ML IJ SOLN: 1.0000 mg | INTRAMUSCULAR | Status: DC | PRN

## 2013-11-05 MED ORDER — TERBUTALINE SULFATE 1 MG/ML IJ SOLN: 0.2500 mg | Freq: Once | INTRAMUSCULAR | Status: DC | PRN

## 2013-11-05 MED ORDER — ONDANSETRON HCL 4 MG PO TABS: 4.0000 mg | ORAL_TABLET | ORAL | Status: DC | PRN

## 2013-11-05 MED ORDER — OXYTOCIN 40 UNITS IN LACTATED RINGERS INFUSION - SIMPLE MED
1.0000 m[IU]/min | INTRAVENOUS | Status: DC
  Administered 2013-11-05: 2 m[IU]/min via INTRAVENOUS

## 2013-11-05 MED ORDER — DIPHENHYDRAMINE HCL 25 MG PO CAPS: 25.0000 mg | ORAL_CAPSULE | Freq: Four times a day (QID) | ORAL | Status: DC | PRN

## 2013-11-05 MED ORDER — OXYCODONE-ACETAMINOPHEN 5-325 MG PO TABS: 1.0000 | ORAL_TABLET | ORAL | Status: DC | PRN

## 2013-11-05 MED ORDER — MEASLES, MUMPS & RUBELLA VAC ~~LOC~~ INJ: 0.5000 mL | INJECTION | Freq: Once | SUBCUTANEOUS | Status: DC

## 2013-11-05 MED ORDER — BENZOCAINE-MENTHOL 20-0.5 % EX AERO: 1.0000 "application " | INHALATION_SPRAY | CUTANEOUS | Status: DC | PRN

## 2013-11-05 MED ORDER — FENTANYL 2.5 MCG/ML BUPIVACAINE 1/10 % EPIDURAL INFUSION (WH - ANES)
INTRAMUSCULAR | Status: DC | PRN
  Administered 2013-11-05: 14 mL/h via EPIDURAL

## 2013-11-05 MED ORDER — MAGNESIUM HYDROXIDE 400 MG/5ML PO SUSP: 30.0000 mL | ORAL | Status: DC | PRN

## 2013-11-05 MED ORDER — DIBUCAINE 1 % RE OINT: 1.0000 "application " | TOPICAL_OINTMENT | RECTAL | Status: DC | PRN

## 2013-11-05 MED ORDER — LACTATED RINGERS IV SOLN: 500.0000 mL | Freq: Once | INTRAVENOUS | Status: DC

## 2013-11-05 MED ORDER — SIMETHICONE 80 MG PO CHEW: 80.0000 mg | CHEWABLE_TABLET | ORAL | Status: DC | PRN

## 2013-11-05 MED ORDER — OXYTOCIN 40 UNITS IN LACTATED RINGERS INFUSION - SIMPLE MED
62.5000 mL/h | INTRAVENOUS | Status: DC
  Filled 2013-11-05: qty 1000

## 2013-11-05 MED ORDER — ZOLPIDEM TARTRATE 5 MG PO TABS: 5.0000 mg | ORAL_TABLET | Freq: Every evening | ORAL | Status: DC | PRN

## 2013-11-05 MED ORDER — LACTATED RINGERS IV SOLN: 500.0000 mL | INTRAVENOUS | Status: DC | PRN

## 2013-11-05 MED ORDER — CITRIC ACID-SODIUM CITRATE 334-500 MG/5ML PO SOLN: 30.0000 mL | ORAL | Status: DC | PRN

## 2013-11-05 MED ORDER — IBUPROFEN 600 MG PO TABS
600.0000 mg | ORAL_TABLET | Freq: Four times a day (QID) | ORAL | Status: DC
  Administered 2013-11-06 – 2013-11-07 (×4): 600 mg via ORAL
  Filled 2013-11-05 (×7): qty 1

## 2013-11-05 MED ORDER — ONDANSETRON HCL 4 MG/2ML IJ SOLN: 4.0000 mg | INTRAMUSCULAR | Status: DC | PRN

## 2013-11-05 MED ORDER — LANOLIN HYDROUS EX OINT: TOPICAL_OINTMENT | CUTANEOUS | Status: DC | PRN

## 2013-11-05 MED ORDER — METHYLERGONOVINE MALEATE 0.2 MG/ML IJ SOLN: 0.2000 mg | INTRAMUSCULAR | Status: DC | PRN

## 2013-11-05 MED ORDER — OXYCODONE-ACETAMINOPHEN 5-325 MG PO TABS: 2.0000 | ORAL_TABLET | ORAL | Status: DC | PRN

## 2013-11-05 MED ORDER — PRENATAL MULTIVITAMIN CH
1.0000 | ORAL_TABLET | Freq: Every day | ORAL | Status: DC
  Administered 2013-11-06: 1 via ORAL
  Filled 2013-11-05: qty 1

## 2013-11-05 MED ORDER — ACETAMINOPHEN 325 MG PO TABS: 650.0000 mg | ORAL_TABLET | ORAL | Status: DC | PRN

## 2013-11-05 MED ORDER — LIDOCAINE HCL (PF) 1 % IJ SOLN
INTRAMUSCULAR | Status: DC | PRN
  Administered 2013-11-05 (×2): 8 mL

## 2013-11-05 MED ORDER — DIPHENHYDRAMINE HCL 50 MG/ML IJ SOLN: 12.5000 mg | INTRAMUSCULAR | Status: DC | PRN

## 2013-11-05 MED ORDER — OXYTOCIN 40 UNITS IN LACTATED RINGERS INFUSION - SIMPLE MED
1.0000 m[IU]/min | INTRAVENOUS | Status: DC
Start: 2013-11-05 — End: 2013-11-05

## 2013-11-05 MED ORDER — FENTANYL 2.5 MCG/ML BUPIVACAINE 1/10 % EPIDURAL INFUSION (WH - ANES)
14.0000 mL/h | INTRAMUSCULAR | Status: DC | PRN
  Administered 2013-11-05: 14 mL/h via EPIDURAL
  Filled 2013-11-05: qty 125

## 2013-11-05 MED ORDER — EPHEDRINE 5 MG/ML INJ
10.0000 mg | INTRAVENOUS | Status: DC | PRN
  Filled 2013-11-05: qty 2

## 2013-11-05 MED ORDER — TETANUS-DIPHTH-ACELL PERTUSSIS 5-2.5-18.5 LF-MCG/0.5 IM SUSP
0.5000 mL | Freq: Once | INTRAMUSCULAR | Status: DC
Start: 2013-11-06 — End: 2013-11-05

## 2013-11-05 MED ORDER — ONDANSETRON HCL 4 MG/2ML IJ SOLN
4.0000 mg | Freq: Four times a day (QID) | INTRAMUSCULAR | Status: DC | PRN
  Administered 2013-11-05: 4 mg via INTRAVENOUS
  Filled 2013-11-05: qty 2

## 2013-11-05 MED ORDER — LACTATED RINGERS IV SOLN
INTRAVENOUS | Status: DC
  Administered 2013-11-05 (×2): via INTRAVENOUS

## 2013-11-05 MED ORDER — WITCH HAZEL-GLYCERIN EX PADS: 1.0000 "application " | MEDICATED_PAD | CUTANEOUS | Status: DC | PRN

## 2013-11-05 NOTE — Progress Notes (Signed)
Pt agrees to starting pitocin

## 2013-11-05 NOTE — Progress Notes (Signed)
Patient uncomfortable with MD order to start pitocin at this time.  Patient prefers to wait several hours. Risks of Prolonged ROM discussed.  Patient will alert RN when she is ready to have pitocin started.  Dr. Jackelyn Knife made aware.

## 2013-11-05 NOTE — Anesthesia Procedure Notes (Signed)
Epidural Patient location during procedure: OB Start time: 11/05/2013 3:52 PM End time: 11/05/2013 3:56 PM  Staffing Anesthesiologist: Leilani Able  Preanesthetic Checklist Completed: patient identified, surgical consent, pre-op evaluation, timeout performed, IV checked, risks and benefits discussed and monitors and equipment checked  Epidural Patient position: sitting Prep: site prepped and draped and DuraPrep Patient monitoring: continuous pulse ox and blood pressure Approach: midline Location: L3-L4 Injection technique: LOR air  Needle:  Needle type: Tuohy  Needle gauge: 17 G Needle length: 9 cm and 9 Needle insertion depth: 4 cm Catheter type: closed end flexible Catheter size: 19 Gauge Catheter at skin depth: 9 cm Test dose: negative and Other  Assessment Sensory level: T9 Events: blood not aspirated, injection not painful, no injection resistance, negative IV test and no paresthesia  Additional Notes Reason for block:procedure for pain

## 2013-11-05 NOTE — MAU Note (Signed)
Pt states she got up @ 0200 to use BR, noted clear watery discharge, happened again around 0500, unsure if SROM.  Denies uc's or bleeding.

## 2013-11-05 NOTE — H&P (Signed)
Sheila Schneider is a 28 y.o. female, G2 P1001, EGA [redacted] weeks with EDC today presenting for eval of leaking fluid.  Leaking off and on since 0200, occ ctx.  Eval in MAU with + fern, VE 2 cm, irreg ctx.  Prenatal care essentially uncomplicated, see prenatal records for complete history.  Maternal Medical History:  Reason for admission: Rupture of membranes and contractions.   Contractions: Frequency: irregular.   Perceived severity is mild.    Fetal activity: Perceived fetal activity is normal.    Prenatal complications: no prenatal complications   OB History   Grav Para Term Preterm Abortions TAB SAB Ect Mult Living   0 0 0 0 0 0 1     Past Medical History  Diagnosis Date  . Lichen sclerosus     of vulva  . History of ovarian cyst   . 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 Wisdom tooth extraction     Family History: family history is negative for Anesthesia problems. Social History:  reports that she has never smoked. She has never used smokeless tobacco. She reports that she does not drink alcohol or use illicit drugs.   Prenatal Transfer Tool  Maternal Diabetes: No Genetic Screening: Normal Maternal Ultrasounds/Referrals: Normal Fetal Ultrasounds or other Referrals:  None Maternal Substance Abuse:  No Significant Maternal Medications:  None Significant Maternal Lab Results:  Lab values include: Group B Strep negative Other Comments:  Hgb E   Review of Systems  Respiratory: Negative.   Cardiovascular: Negative.     Dilation: 2 Effacement (%): 50 Station: -2 Exam by:: Sowder, RNC Blood pressure 119/74, pulse 93, temperature 98.3 F (36.8 C), temperature source Oral, resp. rate 20, height  (1.549 m), weight 54.432 kg (120 lb), last menstrual period 01/29/2013, unknown if currently breastfeeding. Maternal Exam:  Uterine Assessment: Contraction strength is mild.  Contraction frequency  is irregular.   Abdomen: Patient reports no abdominal tenderness. Estimated fetal weight is 7 lbs.   Fetal presentation: vertex  Introitus: Normal vulva. Normal vagina.  Ferning test: positive.  Amniotic fluid character: not assessed.  Pelvis: adequate for delivery.   Cervix: Cervix evaluated by digital exam.     Fetal Exam Fetal Monitor Review: Mode: ultrasound.   Baseline rate: 140.  Variability: moderate (6-25 bpm).   Pattern: accelerations present and no decelerations.    Fetal State Assessment: Category I - tracings are normal.     Physical Exam  Constitutional: She appears well-developed and well-nourished.  Cardiovascular: Normal rate, regular rhythm and normal heart sounds.   No murmur heard. Respiratory: Effort normal and breath sounds normal. No respiratory distress. She has no wheezes.  GI: Soft.    Prenatal labs: ABO, Rh: --/--/AB POS, AB POS (09/17 1005) Antibody: NEG (09/17 1005) Rubella: Nonimmune (02/25 0000) RPR: Nonreactive (02/25 0000)  HBsAg: Negative (02/25 0000)  HIV: Non-reactive (02/25 0000)  GBS: Negative (08/21 0000)  GCT:  102  Assessment/Plan: IUP at 40 weeks with PROM, declined pitocin for augmentation until just now.  Will monitor progress, anticipate SVD.   Hutson Luft D 11/05/2013, 12:25 PM

## 2013-11-05 NOTE — Anesthesia Preprocedure Evaluation (Signed)

## 2013-11-06 ENCOUNTER — Inpatient Hospital Stay (HOSPITAL_COMMUNITY): Admission: RE | Admit: 2013-11-06 | Payer: BC Managed Care – PPO | Source: Ambulatory Visit

## 2013-11-06 NOTE — Progress Notes (Signed)
UR chart review completed.  

## 2013-11-06 NOTE — Progress Notes (Signed)
PPD #1 No problems Afeb, VSS Fundus firm, NT at U-1 Continue routine postpartum care, will do circumcision in the office

## 2013-11-06 NOTE — Lactation Note (Signed)
This note was copied from the chart of Sheila Schneider. Lactation Consultation Note  Patient Name: Sheila Schneider [id number] Date: 11/06/2013 Reason for consult: Initial assessment Baby 17 hours of life. Baby is being bathed by nurse tech. Mom states that she BF her first baby 9 months, but had a low milk supply. Mom states that she has a DEBP at home. Enc mom to offer lots of STS, feed baby with cues and at least every 3 hours. Mom return demonstrated hand expression with colostrum visible from both breasts. Enc mom to nurse during the hours after the baby's bath. Mom given [facility] brochure, aware of OP/BFSG and community resources. Enc mom to call for assistance with latch as needed.   Maternal Data Has patient been taught Hand Expression?: Yes Does the patient have breastfeeding experience prior to this delivery?: Yes  Feeding Feeding Type:  (Baby getting a bath.) Length of feed: 20 min  LATCH Score/Interventions Latch: Grasps breast easily, tongue down, lips flanged, rhythmical sucking.  Audible Swallowing: A few with stimulation Intervention(s): Alternate breast massage  Type of Nipple: Everted at rest and after stimulation  Comfort (Breast/Nipple): Filling, red/small blisters or bruises, mild/mod discomfort (left side)  Problem noted: Mild/Moderate discomfort (start of feeding) Interventions (Mild/moderate discomfort):  (EBM, will give comfort gels)  Hold (Positioning): No assistance needed to correctly position infant at breast.  LATCH Score: 8  Lactation Tools Discussed/Used     Consult Status Consult Status: Follow-up Date: 11/07/13 Follow-up type: In-patient    Geralynn Ochs 11/06/2013, 11:44 AM

## 2013-11-07 MED ORDER — IBUPROFEN 600 MG PO TABS: 600.0000 mg | ORAL_TABLET | Freq: Four times a day (QID) | ORAL | Status: DC

## 2013-11-07 NOTE — Discharge Summary (Signed)
Obstetric Discharge Summary Reason for Admission: onset of labor Prenatal Procedures: none Intrapartum Procedures: spontaneous vaginal delivery Postpartum Procedures: none Complications-Operative and Postpartum: second degree perineal laceration Hemoglobin  Date Value Ref Range Status  11/05/2013 10.9* 12.0 - 15.0 g/dL Final     HCT  Date Value Ref Range Status  11/05/2013 31.7* 36.0 - 46.0 % Final    Physical Exam:  General: alert and cooperative Lochia: appropriate Uterine Fundus: firm   Discharge Diagnoses: Term Pregnancy-delivered  Discharge Information: Date: 11/07/2013 Activity: pelvic rest Diet: routine Medications: Ibuprofen Condition: improved Instructions: refer to practice specific booklet Discharge to: home Follow-up Information   Follow up with Oliver Pila, MD. Schedule an appointment as soon as possible for a visit in 6 weeks. (postpartum)    Specialty:  Obstetrics and Gynecology   Contact information:   [address] [place] [address] [phone]       Newborn Data: Live born female  Birth Weight: 7 lb 1 oz (3204 g) APGAR: 9, 9  Home with mother.  Oliver Pila 11/07/2013, 9:53 AM

## 2013-11-07 NOTE — Progress Notes (Signed)
Post Partum Day 2  Subjective: no complaints, up ad lib and tolerating PO  Objective: Blood pressure 101/79, pulse 71, temperature 98.3 F (36.8 C), temperature source Oral, resp. rate 16, height  (1.549 m), weight 54.432 kg (120 lb), last menstrual period 01/29/2013, SpO2 100.00%, unknown if currently breastfeeding.  Physical Exam:  General: alert and cooperative Lochia: appropriate Uterine Fundus: firm }   Recent Labs  11/05/13 1005  HGB 10.9*  HCT 31.7*    Assessment/Plan: Discharge home   LOS: 2 days   Sheila Schneider W 11/07/2013, 9:51 AM

## 2013-11-12 ENCOUNTER — Inpatient Hospital Stay (HOSPITAL_COMMUNITY): Admission: RE | Admit: 2013-11-12 | Payer: BC Managed Care – PPO | Source: Ambulatory Visit

## 2013-12-21 ENCOUNTER — Encounter (HOSPITAL_COMMUNITY): Payer: Self-pay | Admitting: *Deleted

## 2014-12-09 ENCOUNTER — Emergency Department (HOSPITAL_COMMUNITY)
Admission: EM | Admit: 2014-12-09 | Discharge: 2014-12-09 | Disposition: A | Discharge status: Home / Self Care | Payer: BLUE CROSS/BLUE SHIELD | Attending: Emergency Medicine | Admitting: Emergency Medicine

## 2014-12-09 ENCOUNTER — Encounter (HOSPITAL_COMMUNITY): Payer: Self-pay | Admitting: Emergency Medicine

## 2014-12-09 DIAGNOSIS — Z23 Encounter for immunization: Secondary | ICD-10-CM | POA: Insufficient documentation

## 2014-12-09 DIAGNOSIS — Z872 Personal history of diseases of the skin and subcutaneous tissue: Secondary | ICD-10-CM | POA: Insufficient documentation

## 2014-12-09 DIAGNOSIS — Z8742 Personal history of other diseases of the female genital tract: Secondary | ICD-10-CM | POA: Insufficient documentation

## 2014-12-09 DIAGNOSIS — Z862 Personal history of diseases of the blood and blood-forming organs and certain disorders involving the immune mechanism: Secondary | ICD-10-CM | POA: Insufficient documentation

## 2014-12-09 DIAGNOSIS — Z203 Contact with and (suspected) exposure to rabies: Secondary | ICD-10-CM

## 2014-12-09 MED ORDER — RABIES IMMUNE GLOBULIN 150 UNIT/ML IM INJ
20.0000 [IU]/kg | INJECTION | Freq: Once | INTRAMUSCULAR | Status: AC
  Administered 2014-12-09: 900 [IU] via INTRAMUSCULAR
  Filled 2014-12-09: qty 6

## 2014-12-09 MED ORDER — RABIES VACCINE, PCEC IM SUSR
1.0000 mL | Freq: Once | INTRAMUSCULAR | Status: AC
  Administered 2014-12-09: 1 mL via INTRAMUSCULAR
  Filled 2014-12-09: qty 1

## 2014-12-09 NOTE — ED Notes (Signed)
Pt stable, ambulatory, states understanding of discharge instructions 

## 2014-12-09 NOTE — ED Notes (Signed)
Pt reports that her dog killed a raccoon yesterday and it tested positive for rabies. She doesn't remember specifically if she came into contact with the dogs saliva. She just wants to take precautions.

## 2014-12-09 NOTE — ED Provider Notes (Signed)
CSN: [id number]     Arrival date & time 12/09/14  1737 History   First MD Initiated Contact with Patient 12/09/14 1757     Chief Complaint  Patient presents with  . Rabies Injection     (Consider location/radiation/quality/duration/timing/severity/associated sxs/prior Treatment) HPI Sheila Schneider is a 29 y.o. female who presents to ED with complaint of exposure to rabies. States her dog killed a racoon yesterday. States racoon got tested positive for rabies. Pt states she believes dogs rabies are out of date. She states he last had his shots 2 years ago. Animal control was a dog. Dog has no signs of rabies at this time. Patient states the doctor did not bite her but she said that she did come in contact with dogs saliva. Pt was was told to come here for rabies vaccines.   Past Medical History  Diagnosis Date  . Lichen sclerosus     of vulva  . History of ovarian cyst   . 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 Wisdom tooth extraction     Family History  Problem Relation Age of Onset  . Anesthesia problems Neg Hx    Social History  Substance Use Topics  . Smoking status: Never Smoker   . Smokeless tobacco: Never Used  . Alcohol Use: No   OB History    Gravida Para Term Preterm AB TAB SAB Ectopic Multiple Living   2 2 2  0 0 0 0 0 0 2     Review of Systems  Constitutional: Negative for fever, chills and fatigue.  Neurological: Negative for headaches.  All other systems reviewed and are negative.     Allergies  Review of patient's allergies indicates no known allergies.  Home Medications   Prior to Admission medications   Medication Sig Start Date End Date Taking? Authorizing Provider  ibuprofen (ADVIL,MOTRIN) 600 MG tablet Take 1 tablet (600 mg total) by mouth every 6 (six) hours. 11/07/13   Huel CoteKathy Richardson, MD  prenatal vitamin w/FE, FA (NATACHEW) 29-1 MG CHEW Chew 1 tablet by mouth  daily.    Historical Provider, MD   BP 106/68 mmHg  Pulse 79  Temp(Src) 98.3 F (36.8 C) (Oral)  Resp 16  SpO2 98%  LMP 11/22/2014 Physical Exam  Constitutional: She appears well-developed and well-nourished. No distress.  Eyes: Conjunctivae are normal.  Neck: Neck supple.  Neurological: She is alert.  Skin: Skin is warm and dry.  Nursing note and vitals reviewed.   ED Course  Procedures (including critical care time) Labs Review Labs Reviewed - No data to display  Imaging Review No results found. I have personally reviewed and evaluated these images and lab results as part of my medical decision-making.   EKG Interpretation None      MDM   Final diagnoses:  Rabies exposure    Pt with no complaints, here for treatment for rabies exposure. Patient is concerned because she did, in contact with saliva of her dog whose tetanus status is out of date and who definitely killed by a rabies infected raccoon. I discussed the patient whether we need to just watch the dog for signs or rabies or for testing, however she was adamant about getting vaccines. First series of vaccines and immunoglobulin given in the ER.  Filed Vitals:   12/09/14 1752 12/09/14 1911  BP: 106/68   Pulse: 79   Temp: 98.3 F (  36.8 C)   TempSrc: Oral   Resp: 16   Weight:  97 lb 2 oz (44.056 kg)  SpO2: 98%        Jaynie Crumble, PA-C 12/09/14 2006  Doug Sou, MD 12/10/14 4098

## 2014-12-09 NOTE — Discharge Instructions (Signed)
Please follow up with [facility] in 3 days.    Rabies Rabies is a viral infection that can be spread to people from infected animals. The infection affects the brain and central nervous system. Once the disease develops, it almost always causes death. Because of this, when a person is bitten by an animal that may have rabies, treatment to prevent rabies often needs to be started whether or not the animal is known to be infected. Prompt treatment with the rabies vaccine and rabies immune globulin is very effective at preventing the infection from developing in people who have been exposed to the rabies virus. CAUSES  Rabies is caused by a virus that lives inside some animals. When a person is bitten by an infected animal, the rabies virus is spread to the person through the infected spit (saliva) of the animal. This virus can be carried by animals such as dogs, cats, skunks, bats, woodchucks, raccoons, coyotes, and foxes. SYMPTOMS  By the time symptoms appear, rabies is usually fatal for the person. Common symptoms include:  Headache.  Fever.  Fatigue and weakness.  Agitation.  Anxiety.  Confusion.  Unusual behavior, such as hyperactivity, fear of water (hydrophobia), or fear of air (aerophobia).  Hallucinations.  Insomnia.  Weakness in the arms or legs.  Difficulty swallowing. Most people get sick in 1-3 months after being bitten. This often varies and may depend on the location of the bite. The infection will take less time to develop if the bite occurred closer to the head.  DIAGNOSIS  To determine if a person is infected, several tests must be performed, such as:  A skin biopsy.  A saliva test.  A lumbar puncture to remove spinal fluid so it can be examined.  Blood tests. TREATMENT  Treatment to prevent the infection from developing (post-exposure prophylaxis, PEP) is often started before knowing for sure if the person has been exposed to the rabies virus.  PEP involves cleaning the wound, giving an antibody injection (rabies immune globulin), and giving a series of rabies vaccine injections. The series of injections are usually given over a two-week period. If possible, the animal that bit the person will be observed to see if it remains healthy. If the animal has been killed, it can be sent to a state laboratory and examined to see if the animal had rabies. If a person is bitten by a domestic animal (dog, cat, or ferret) that appears healthy and can be observed to see if it remains healthy, often no further treatment is necessary other than care of the wounds caused by the animal. Rabies is often a fatal illness once the infection develops in a person. Although a few people who developed rabies have survived after experimental treatment with certain drugs, all these survivors still had severe nervous system problems after the treatment. This is why caregivers use extra caution and begin PEP treatment for people who have been bitten by animals that are possibly infected with rabies.  HOME CARE INSTRUCTIONS  If you were bitten by an unknown animal, make sure you know your caregiver's instructions for follow-up. If the animal was sent to a laboratory for examination, ask when the test results will be ready. Make sure you get the test results.  Take these steps to care for your wound:  Keep the wound clean, dry, and dressed as directed by your caregiver.  Keep the injured part elevated as much as possible.  Do not resume use of the affected  area until directed.  Only take over-the-counter or prescription medicines as directed by your caregiver.  Keep all follow-up appointments as directed by your caregiver. PREVENTION  To prevent rabies, people need to reduce their risk of having contact with infected animals.   Make sure your pets (dogs, cats, ferrets) are vaccinated against rabies. Keep these vaccinations up-to-date as directed by your  veterinarian.  Supervise your pets when they are outside. Keep them away from wild animals.  Call your local animal control services to report any stray animals. These animals may not be vaccinated.  Stay away from stray or wild animals.  Consider getting the rabies vaccine (preexposure) if you are traveling to an area where rabies is common or if your job or activities involve possible contact with wild or stray animals. Discuss this with your caregiver.   This information is not intended to replace advice given to you by your health care provider. Make sure you discuss any questions you have with your health care provider.   Document Released: 02/05/2005 Document Revised: 02/26/2014 Document Reviewed: 09/04/2011 Elsevier Interactive Patient Education [employer]

## 2014-12-12 ENCOUNTER — Encounter (HOSPITAL_COMMUNITY): Payer: Self-pay | Admitting: Emergency Medicine

## 2014-12-12 ENCOUNTER — Emergency Department (HOSPITAL_COMMUNITY)
Admission: EM | Admit: 2014-12-12 | Discharge: 2014-12-12 | Disposition: A | Discharge status: Home / Self Care | Payer: BLUE CROSS/BLUE SHIELD | Source: Home / Self Care

## 2014-12-12 MED ORDER — RABIES VACCINE, PCEC IM SUSR
INTRAMUSCULAR | Status: AC
  Filled 2014-12-12: qty 1

## 2014-12-12 MED ORDER — RABIES VACCINE, PCEC IM SUSR
1.0000 mL | Freq: Once | INTRAMUSCULAR | Status: AC
  Administered 2014-12-12: 1 mL via INTRAMUSCULAR

## 2014-12-12 NOTE — ED Notes (Signed)
[facility] series of Rabies Vacine. The patient's initial injections were on 12/09/2014.

## 2014-12-16 ENCOUNTER — Emergency Department (HOSPITAL_COMMUNITY)
Admission: EM | Admit: 2014-12-16 | Discharge: 2014-12-16 | Disposition: A | Discharge status: Home / Self Care | Payer: Self-pay | Source: Home / Self Care

## 2014-12-16 ENCOUNTER — Encounter (HOSPITAL_COMMUNITY): Payer: Self-pay

## 2014-12-16 DIAGNOSIS — Z203 Contact with and (suspected) exposure to rabies: Secondary | ICD-10-CM

## 2014-12-16 MED ORDER — RABIES VACCINE, PCEC IM SUSR
1.0000 mL | Freq: Once | INTRAMUSCULAR | Status: AC
  Administered 2014-12-16: 1 mL via INTRAMUSCULAR

## 2014-12-16 MED ORDER — RABIES VACCINE, PCEC IM SUSR
INTRAMUSCULAR | Status: AC
  Filled 2014-12-16: qty 1

## 2014-12-16 NOTE — ED Notes (Signed)
Rabies injection . Day #7. Denies problems related to exposure

## 2014-12-23 ENCOUNTER — Emergency Department (HOSPITAL_COMMUNITY)
Admission: EM | Admit: 2014-12-23 | Discharge: 2014-12-23 | Disposition: A | Discharge status: Home / Self Care | Payer: Self-pay | Source: Home / Self Care

## 2014-12-23 ENCOUNTER — Encounter (HOSPITAL_COMMUNITY): Payer: Self-pay

## 2014-12-23 DIAGNOSIS — Z203 Contact with and (suspected) exposure to rabies: Secondary | ICD-10-CM

## 2014-12-23 MED ORDER — RABIES VACCINE, PCEC IM SUSR
INTRAMUSCULAR | Status: AC
  Filled 2014-12-23: qty 1

## 2014-12-23 MED ORDER — RABIES VACCINE, PCEC IM SUSR
1.0000 mL | Freq: Once | INTRAMUSCULAR | Status: AC
  Administered 2014-12-23: 1 mL via INTRAMUSCULAR

## 2014-12-23 NOTE — ED Notes (Signed)
Day #14, final day of series. No c/o

## 2016-02-20 NOTE — L&D Delivery Note (Signed)
Delivery Note Pt continued to have deep variable decelerations and was examined and completely dilated.  She was instructed to push and pushed well for bout 15 minutes.  At 3:34 AM a healthy female was delivered via Vaginal, Spontaneous (Presentation: LOA ).  APGAR: 8, 9; weight pending.   Placenta status: delivered spontaneously .  Cord:  with the following complications:short .   Anesthesia:  Epidural Episiotomy:  none Lacerations: 1st degree;Labial left abrasion Suture Repair: 3.0 vicryl rapide Est. Blood Loss (mL): 350  Mom to postpartum.  Baby to Couplet care / Skin to Skin.  Oliver PilaKathy W Jakeob Tullis 02/17/2017, 4:02 AM

## 2016-04-18 NOTE — Patient Instructions (Signed)
Your procedure is scheduled on:  Monday, April 30, 2016  Enter through the Main Entrance of [facility] at:  6:00 AM  Pick up the phone at the desk and dial [phone].  Call this number if you have problems the morning of surgery: [phone].  Remember: Do NOT eat food or drink after:  Midnight Sunday, April 29, 2016  Take these medicines the morning of surgery with a SIP OF WATER:  None  Stop ALL herbal medications at this time  Do NOT smoke the day of surgery.  Do NOT wear jewelry (body piercing), metal hair clips/bobby pins, make-up, or nail polish. Do NOT wear lotions, powders, or perfumes.  You may wear deodorant. Do NOT shave for 48 hours prior to surgery. Do NOT bring valuables to the hospital. Contacts, dentures, or bridgework may not be worn into surgery.  Have a responsible adult drive you home and stay with you for 24 hours after your procedure  Bring a copy of your healthcare power of attorney and living will documents.  **Effective Friday, Jan. 12, 2018,  will implement no hospital visitations from children age 32 and younger due to a steady increase in flu activity in our community and hospitals. **

## 2016-04-19 ENCOUNTER — Inpatient Hospital Stay (HOSPITAL_COMMUNITY)
Admission: RE | Admit: 2016-04-19 | Discharge: 2016-04-19 | Disposition: A | Discharge status: Home / Self Care | Payer: BLUE CROSS/BLUE SHIELD | Source: Ambulatory Visit

## 2016-04-24 NOTE — Patient Instructions (Signed)
Your procedure is scheduled on:  Monday, April 30, 2016  Enter through the Main Entrance of [facility] at:  6:00 AM  Pick up the phone at the desk and dial [phone].  Call this number if you have problems the morning of surgery: [phone].  Remember: Do NOT eat food or drink after:  Midnight Sunday  Take these medicines the morning of surgery with a SIP OF WATER:  None  Stop ALL herbal medications at this time  Do NOT smoke the day of surgery.  Do NOT wear jewelry (body piercing), metal hair clips/bobby pins, make-up, or nail polish. Do NOT wear lotions, powders, or perfumes.  You may wear deodorant. Do NOT shave for 48 hours prior to surgery. Do NOT bring valuables to the hospital. Contacts, dentures, or bridgework may not be worn into surgery.  Have a responsible adult drive you home and stay with you for 24 hours after your procedure  Bring a copy of your healthcare power of attorney and living will documents.  **Effective Friday, Jan. 12, 2018, [facility] will implement no hospital visitations from children age 32 and younger due to a steady increase in flu activity in our community and hospitals. **

## 2016-04-25 ENCOUNTER — Encounter (HOSPITAL_COMMUNITY): Payer: Self-pay

## 2016-04-25 ENCOUNTER — Encounter (HOSPITAL_COMMUNITY)
Admission: RE | Admit: 2016-04-25 | Discharge: 2016-04-25 | Disposition: A | Discharge status: Home / Self Care | Payer: BLUE CROSS/BLUE SHIELD | Source: Ambulatory Visit | Attending: Obstetrics and Gynecology | Admitting: Obstetrics and Gynecology

## 2016-04-25 DIAGNOSIS — Z01812 Encounter for preprocedural laboratory examination: Secondary | ICD-10-CM | POA: Diagnosis not present

## 2016-04-25 HISTORY — DX: Anesthesia of skin: R20.0

## 2016-04-25 HISTORY — DX: Personal history of other diseases of the musculoskeletal system and connective tissue: Z87.39

## 2016-04-25 LAB — CBC
HEMATOCRIT: 34.3 % — AB (ref 36.0–46.0)
Hemoglobin: 11.3 g/dL — ABNORMAL LOW (ref 12.0–15.0)
MCH: 21 pg — ABNORMAL LOW (ref 26.0–34.0)
MCHC: 32.9 g/dL (ref 30.0–36.0)
MCV: 63.6 fL — ABNORMAL LOW (ref 78.0–100.0)
Platelets: 285 10*3/uL (ref 150–400)
RBC: 5.39 MIL/uL — ABNORMAL HIGH (ref 3.87–5.11)
RDW: 15 % (ref 11.5–15.5)
WBC: 4.6 10*3/uL (ref 4.0–10.5)

## 2016-04-27 NOTE — Anesthesia Preprocedure Evaluation (Addendum)
Anesthesia Evaluation  Patient identified by MRN, date of birth, ID band Patient awake    Reviewed: Allergy & Precautions, H&P , Patient's Chart, lab work & pertinent test results, reviewed documented beta blocker date and time   Airway Mallampati: II  TM Distance: >3 FB Neck ROM: full    Dental no notable dental hx.    Pulmonary    Pulmonary exam normal breath sounds clear to auscultation       Cardiovascular  Rhythm:regular Rate:Normal     Neuro/Psych    GI/Hepatic   Endo/Other    Renal/GU      Musculoskeletal   Abdominal   Peds  Hematology   Anesthesia Other Findings Very large tonsils  Reproductive/Obstetrics                            Anesthesia Physical Anesthesia Plan  ASA: II  Anesthesia Plan: General   Post-op Pain Management:    Induction: Intravenous  Airway Management Planned: LMA  Additional Equipment:   Intra-op Plan:   Post-operative Plan:   Informed Consent: I have reviewed the patients History and Physical, chart, labs and discussed the procedure including the risks, benefits and alternatives for the proposed anesthesia with the patient or authorized representative who has indicated his/her understanding and acceptance.   Dental Advisory Given  Plan Discussed with: CRNA and Surgeon  Anesthesia Plan Comments: (Discussed sedation and potential to need to place airway or ETT if warranted by clinical changes intra-operatively. We will start procedure as MAC.)       Anesthesia Quick Evaluation

## 2016-04-29 NOTE — H&P (Signed)
Sheila Schneider is an 31 y.o. female. 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  She now has increased irritation from this lesion and desires removal.  Pertinent Gynecological History: Last pap: abnormal: LSIL, benign biopsy Date: 02/2016 OB History: G2, P2002   Menstrual History: Patient's last menstrual period was 04/24/2016 (exact date).    Past Medical History:  Diagnosis Date  . H/O burning pain in leg    right calf  . Hemoglobin E disease (HCC)    treat like SST in pregnancy  . History of ovarian cyst   . Lichen sclerosus    of vulva  . Right leg numbness     Past Surgical History:  Procedure Laterality Date  . WISDOM TOOTH EXTRACTION      Family History  Problem Relation Age of Onset  . Anesthesia problems Neg Hx     Social History:  reports that she has never smoked. She has never used smokeless tobacco. She reports that she drinks alcohol. She reports that she does not use drugs.  Allergies: No Known Allergies  No prescriptions prior to admission.    Review of Systems  Respiratory: Negative.   Cardiovascular: Negative.   Gastrointestinal: Negative.   Genitourinary: Negative.     Last menstrual period 04/24/2016, unknown if currently breastfeeding. Physical Exam  Constitutional: She appears well-developed and well-nourished.  Neck: Neck supple. No thyromegaly present.  Cardiovascular: Normal rate, regular rhythm and normal heart sounds.   No murmur heard. Respiratory: Effort normal and breath sounds normal. No respiratory distress. She has no wheezes.  GI: Soft. She exhibits no distension and no mass. There is no tenderness.  Genitourinary: Vagina normal and uterus normal.  Genitourinary Comments: 2 cm mass with surrounding skin changes just superior and to the right of her clitoris No adnexal mass    No results found for this or any previous visit (from the past 24  hour(s)).  No results found.  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  Discussed surgical procedure, risks, chances of relieving symptoms.  Will admit for removal of this lesion.    Sheila Schneider D 04/29/2016, 8:05 PM

## 2016-04-30 ENCOUNTER — Encounter (HOSPITAL_COMMUNITY): Payer: Self-pay

## 2016-04-30 ENCOUNTER — Ambulatory Visit (HOSPITAL_COMMUNITY)
Admission: RE | Admit: 2016-04-30 | Discharge: 2016-04-30 | Disposition: A | Discharge status: Home / Self Care | Payer: BLUE CROSS/BLUE SHIELD | Source: Ambulatory Visit | Attending: Obstetrics and Gynecology | Admitting: Obstetrics and Gynecology

## 2016-04-30 ENCOUNTER — Ambulatory Visit (HOSPITAL_COMMUNITY): Payer: BLUE CROSS/BLUE SHIELD | Admitting: Anesthesiology

## 2016-04-30 ENCOUNTER — Encounter (HOSPITAL_COMMUNITY)
Admission: RE | Disposition: A | Discharge status: Home / Self Care | Payer: Self-pay | Source: Ambulatory Visit | Attending: Obstetrics and Gynecology

## 2016-04-30 DIAGNOSIS — N909 Noninflammatory disorder of vulva and perineum, unspecified: Secondary | ICD-10-CM | POA: Diagnosis present

## 2016-04-30 DIAGNOSIS — N9069 Other specified hypertrophy of vulva: Secondary | ICD-10-CM | POA: Diagnosis not present

## 2016-04-30 DIAGNOSIS — N9089 Other specified noninflammatory disorders of vulva and perineum: Secondary | ICD-10-CM

## 2016-04-30 HISTORY — PX: VULVAR LESION REMOVAL: SHX5391

## 2016-04-30 LAB — PREGNANCY, URINE: PREG TEST UR: NEGATIVE

## 2016-04-30 SURGERY — VULVAR LESION
Anesthesia: Monitor Anesthesia Care

## 2016-04-30 MED ORDER — KETOROLAC TROMETHAMINE 30 MG/ML IJ SOLN
INTRAMUSCULAR | Status: AC
  Filled 2016-04-30: qty 1

## 2016-04-30 MED ORDER — SCOPOLAMINE 1 MG/3DAYS TD PT72
1.0000 | MEDICATED_PATCH | Freq: Once | TRANSDERMAL | Status: DC
  Administered 2016-04-30: 1.5 mg via TRANSDERMAL

## 2016-04-30 MED ORDER — ACETAMINOPHEN 160 MG/5ML PO SOLN
ORAL | Status: AC
  Administered 2016-04-30: 975 mg via ORAL
  Filled 2016-04-30: qty 40.6

## 2016-04-30 MED ORDER — BUPIVACAINE-EPINEPHRINE (PF) 0.5% -1:200000 IJ SOLN
INTRAMUSCULAR | Status: AC
  Filled 2016-04-30: qty 30

## 2016-04-30 MED ORDER — BUPIVACAINE HCL (PF) 0.25 % IJ SOLN
INTRAMUSCULAR | Status: AC
  Filled 2016-04-30: qty 30

## 2016-04-30 MED ORDER — LIDOCAINE 2% (20 MG/ML) 5 ML SYRINGE
INTRAMUSCULAR | Status: DC | PRN
Start: 2016-04-30 — End: 2016-06-13
  Administered 2016-04-30 (×2): 50 mg via INTRAVENOUS

## 2016-04-30 MED ORDER — LACTATED RINGERS IV SOLN
INTRAVENOUS | Status: DC
  Administered 2016-04-30: 06:00:00 via INTRAVENOUS

## 2016-04-30 MED ORDER — FENTANYL CITRATE (PF) 100 MCG/2ML IJ SOLN
INTRAMUSCULAR | Status: DC | PRN
  Administered 2016-04-30 (×2): 50 ug via INTRAVENOUS

## 2016-04-30 MED ORDER — ACETAMINOPHEN 160 MG/5ML PO SOLN
975.0000 mg | Freq: Once | ORAL | Status: AC
  Administered 2016-04-30: 975 mg via ORAL

## 2016-04-30 MED ORDER — HYDROCODONE-ACETAMINOPHEN 5-325 MG PO TABS: 1.0000 | ORAL_TABLET | Freq: Four times a day (QID) | ORAL | 0 refills | Status: DC | PRN

## 2016-04-30 MED ORDER — GLYCOPYRROLATE 0.2 MG/ML IJ SOLN
INTRAMUSCULAR | Status: AC
  Filled 2016-04-30: qty 1

## 2016-04-30 MED ORDER — LACTATED RINGERS IV SOLN: INTRAVENOUS | Status: DC

## 2016-04-30 MED ORDER — PROPOFOL 10 MG/ML IV BOLUS
INTRAVENOUS | Status: DC | PRN
  Administered 2016-04-30: 20 mg via INTRAVENOUS
  Administered 2016-04-30: 130 mg via INTRAVENOUS

## 2016-04-30 MED ORDER — BACITRACIN-NEOMYCIN-POLYMYXIN 400-5-5000 EX OINT
TOPICAL_OINTMENT | CUTANEOUS | Status: AC
  Filled 2016-04-30: qty 1

## 2016-04-30 MED ORDER — SCOPOLAMINE 1 MG/3DAYS TD PT72
MEDICATED_PATCH | TRANSDERMAL | Status: AC
  Administered 2016-04-30: 1.5 mg via TRANSDERMAL
  Filled 2016-04-30: qty 1

## 2016-04-30 MED ORDER — BACITRACIN-NEOMYCIN-POLYMYXIN 400-5-5000 EX OINT
TOPICAL_OINTMENT | CUTANEOUS | Status: DC | PRN
  Administered 2016-04-30: 1 via TOPICAL

## 2016-04-30 MED ORDER — PROPOFOL 10 MG/ML IV BOLUS
INTRAVENOUS | Status: AC
  Filled 2016-04-30: qty 20

## 2016-04-30 MED ORDER — LIDOCAINE HCL (CARDIAC) 20 MG/ML IV SOLN
INTRAVENOUS | Status: AC
  Filled 2016-04-30: qty 5

## 2016-04-30 MED ORDER — MIDAZOLAM HCL 5 MG/5ML IJ SOLN
INTRAMUSCULAR | Status: DC | PRN
  Administered 2016-04-30: 1 mg via INTRAVENOUS

## 2016-04-30 MED ORDER — DEXAMETHASONE SODIUM PHOSPHATE 4 MG/ML IJ SOLN
INTRAMUSCULAR | Status: AC
  Filled 2016-04-30: qty 1

## 2016-04-30 MED ORDER — KETOROLAC TROMETHAMINE 30 MG/ML IJ SOLN
INTRAMUSCULAR | Status: DC | PRN
  Administered 2016-04-30: 30 mg via INTRAVENOUS

## 2016-04-30 MED ORDER — ONDANSETRON HCL 4 MG/2ML IJ SOLN
INTRAMUSCULAR | Status: DC | PRN
  Administered 2016-04-30: 4 mg via INTRAVENOUS

## 2016-04-30 MED ORDER — ONDANSETRON HCL 4 MG/2ML IJ SOLN
INTRAMUSCULAR | Status: AC
  Filled 2016-04-30: qty 2

## 2016-04-30 MED ORDER — BUPIVACAINE-EPINEPHRINE 0.5% -1:200000 IJ SOLN
INTRAMUSCULAR | Status: DC | PRN
  Administered 2016-04-30: 10 mL

## 2016-04-30 MED ORDER — FENTANYL CITRATE (PF) 100 MCG/2ML IJ SOLN
INTRAMUSCULAR | Status: AC
  Filled 2016-04-30: qty 2

## 2016-04-30 MED ORDER — DEXAMETHASONE SODIUM PHOSPHATE 4 MG/ML IJ SOLN
INTRAMUSCULAR | Status: DC | PRN
  Administered 2016-04-30: 4 mg via INTRAVENOUS

## 2016-04-30 MED ORDER — MIDAZOLAM HCL 2 MG/2ML IJ SOLN
INTRAMUSCULAR | Status: AC
  Filled 2016-04-30: qty 2

## 2016-04-30 SURGICAL SUPPLY — 22 items
BLADE SURG 15 STRL LF C SS BP (BLADE) ×1 IMPLANT
BLADE SURG 15 STRL SS (BLADE) ×3
CLOTH BEACON ORANGE TIMEOUT ST (SAFETY) ×3 IMPLANT
COUNTER NEEDLE 1200 MAGNETIC (NEEDLE) ×3 IMPLANT
ELECT REM PT RETURN 9FT ADLT (ELECTROSURGICAL) ×3
ELECTRODE REM PT RTRN 9FT ADLT (ELECTROSURGICAL) ×1 IMPLANT
GLOVE BIO SURGEON STRL SZ8 (GLOVE) ×3 IMPLANT
GLOVE BIOGEL PI IND STRL 7.0 (GLOVE) ×1 IMPLANT
GLOVE BIOGEL PI INDICATOR 7.0 (GLOVE) ×2
GLOVE ORTHO TXT STRL SZ7.5 (GLOVE) ×3 IMPLANT
GOWN STRL REUS W/TWL LRG LVL3 (GOWN DISPOSABLE) ×6 IMPLANT
HOSE NS SMOKE EVAC 7/8 X6 (MISCELLANEOUS) IMPLANT
HOSE NS SMOKE EVAC 7/8 X6' (MISCELLANEOUS)
NEEDLE HYPO 22GX1.5 SAFETY (NEEDLE) ×4 IMPLANT
PACK VAGINAL MINOR WOMEN LF (CUSTOM PROCEDURE TRAY) ×3 IMPLANT
PAD OB MATERNITY 4.3X12.25 (PERSONAL CARE ITEMS) ×3 IMPLANT
PENCIL BUTTON HOLSTER BLD 10FT (ELECTRODE) ×3 IMPLANT
REDUCER FITTING SMOKE EVAC (MISCELLANEOUS) IMPLANT
SUT VIC AB 3-0 PS2 18 (SUTURE) ×6 IMPLANT
SUT VIC AB 3-0 SH 18 (SUTURE) IMPLANT
TOWEL OR 17X24 6PK STRL BLUE (TOWEL DISPOSABLE) ×6 IMPLANT
WATER STERILE IRR 1000ML POUR (IV SOLUTION) ×3 IMPLANT

## 2016-04-30 NOTE — Op Note (Signed)
Preoperative diagnosis: Vulvar lesion Postoperative diagnosis: Vulvar lesion Procedure: Removal of vulvar lesion Surgeon: Lavina Hammanodd Samantha Ragen M.D. Anesthesia: LMA Findings: She had a 2 cm solid lesion at 11:00 just superior to her clitoris with surrounding decreased pigmentation of the skin. Estimated blood loss: 20 mL Specimens: Vulvar lesion  Procedure in detail:  The patient was taken to the operating room and placed in the dorsal supine position. She was then placed in mobile stirrups. General anesthesia with an LMA was then initiated. Perineum was then prepped and draped in the usual sterile fashion. The vulvar lesion was identified. A scalpel was used to incise circumferentially around this lesion. The lesion was then grasped with an Allis clamp and elevated. Scalpel was then used to come across the deep portion of the solid lesion removing it. Bleeding was controlled with electrocautery. The vertical incision was then closed with running locking 3-0 Vicryl with adequate closure and adequate hemostasis after holding pressure. The area was then infiltrated with quarter percent Marcaine. Patient tolerated the procedure well. She was taken down from stirrups and taken to the recovery room in stable condition. Counts were correct and she had PAS hose on throughout the procedure.

## 2016-04-30 NOTE — Interval H&P Note (Signed)
History and Physical Interval Note:  04/30/2016 7:13 AM  Sheila Schneider  has presented today for surgery, with the diagnosis of vulvar lesion  The various methods of treatment have been discussed with the patient and family. After consideration of risks, benefits and other options for treatment, the patient has consented to  Procedure(s): VULVAR LESION (N/A) as a surgical intervention .  The patient's history has been reviewed, patient examined, no change in status, stable for surgery.  I have reviewed the patient's chart and labs.  Questions were answered to the patient's satisfaction.     [name]

## 2016-04-30 NOTE — Discharge Instructions (Signed)
Refer to this sheet in the next few weeks. These instructions provide you with information about caring for yourself after your procedure. Your health care provider may also give you more specific instructions. Your treatment has been planned according to current medical practices, but problems sometimes occur. Call your health care provider if you have any problems or questions after your procedure. What can I expect after the procedure? After the procedure, it is common to have:  Vaginal pain.  Vaginal numbness.  Vaginal swelling.  Bloody vaginal discharge. Follow these instructions at home: Activity    Rest as told by your health care provider.  Do not lift, push, or pull more than 5 lb (2.3 kg).  Avoid activities that require a lot of energy for as long as told by your health care provider. This includes any exercise.  Raise (elevate) your legs while sitting or lying down.  Avoid standing or sitting in one place for long periods of time.  Do not cross your legs, especially when sitting. Bathing   Do not take baths, swim, or use a hot tub until your health care provider approves. You can take a shower 24 hours after your surgery.   After passing urine or a bowel movement, wipe yourself from front to back and clean your vaginal area using a spray bottle.  If told by your health care provider, take a sitz bath to help with discomfort. This is a warm water bath you take while sitting down.  Do this 3-4 times per day, or as often as told by your health care provider.  The water should only come up to your hips and cover your buttocks.  You may pat the area dry with a soft, clean towel. If needed, you may then gently dry the area with a hair dryer on a cool setting for 5-10 minutes. An enclosed box fan may also be used to gently dry the area. Incision care    Follow instructions from your health care provider about how to take care of your incision. Make sure you:  Wash  your hands with soap and water before you change your bandage (dressing). If soap and water are not available, use hand sanitizer.  Change your dressing as told by your health care provider.  Leave stitches (sutures), skin glue, adhesive strips, or surgical clips in place.   Check your incision area every day for signs of infection. Check for:  More redness, swelling, or pain.  More fluid or blood.  Warmth.  Pus or a bad smell. Lifestyle   Do not douche or use tampons until your health care provider approves.  Do not have sex until your health care provider approves. Tell your health care provider if you have pain or numbness when you return to sexual activity.  Wear comfortable, loose-fitting clothing. Driving   Do not drive or operate heavy machinery while taking prescription pain medicine.  Do not drive for 24 hours if you received a medicine to help you relax (sedative). General instructions    Take over-the-counter and prescription medicines only as told by your health care provider.  Take a stool softener to help prevent constipation as told by your health care provider. You may need to do this if you are taking prescription pain medicine.  Drink enough fluid to keep your urine clear or pale yellow.  If you were sent home with a drain, take care of it as told by your health care provider.  Wear compression stockings as  told by your health care provider. These stockings help to prevent blood clots and reduce swelling in your legs.  Keep all follow-up visits as told by your health care provider. This is important.   Contact a health care provider if:  You have more redness, swelling, or pain around your incision.  You have more fluid or blood coming from your incision.  Your incision feels warm to the touch.  You have pus or a bad smell coming from your incision.  You have a fever.  You have painful or bloody urination.  You feel nauseous or you  vomit.  You develop diarrhea.  You develop constipation.  You develop a rash.  You feel dizzy or light-headed.  You have pain that does not get better with medicine.  Your incision breaks open. Get help right away if:  You faint.  You develop leg or chest pain.  You develop abdominal pain.  You develop shortness of breath. This information is not intended to replace advice given to you by your health care provider. Make sure you discuss any questions you have with your health care provider. Document Released: 09/20/2003 Document Revised: 07/14/2015 Document Reviewed: 01/31/2015 Elsevier Interactive Patient Education  2017 [employer]  General Anesthesia, Adult, Care After These instructions provide you with information about caring for yourself after your procedure. Your health care provider may also give you more specific instructions. Your treatment has been planned according to current medical practices, but problems sometimes occur. Call your health care provider if you have any problems or questions after your procedure. What can I expect after the procedure? After the procedure, it is common to have:  Vomiting.  A sore throat.  Mental slowness. It is common to feel:  Nauseous.  Cold or shivery.  Sleepy.  Tired.  Sore or achy, even in parts of your body where you did not have surgery.   Follow these instructions at home: For at least 24 hours after the procedure:   Do not:  Participate in activities where you could fall or become injured.  Drive.  Use heavy machinery.  Drink alcohol.  Take sleeping pills or medicines that cause drowsiness.  Make important decisions or sign legal documents.  Take care of children on your own.  Rest. Eating and drinking   If you vomit, drink water, juice, or soup when you can drink without vomiting.  Drink enough fluid to keep your urine clear or pale yellow.  Make sure you have little or no nausea  before eating solid foods.  Follow the diet recommended by your health care provider. General instructions   Have a responsible adult stay with you until you are awake and alert.  Return to your normal activities as told by your health care provider. Ask your health care provider what activities are safe for you.  Take over-the-counter and prescription medicines only as told by your health care provider.  If you smoke, do not smoke without supervision.  Keep all follow-up visits as told by your health care provider. This is important.

## 2016-04-30 NOTE — Anesthesia Procedure Notes (Signed)
Procedure Name: LMA Insertion Date/Time: 04/30/2016 7:34 AM Performed by: Janeece AgeeWRAPE, Jonathin Heinicke W Pre-anesthesia Checklist: Patient identified, Emergency Drugs available, Suction available, Timeout performed and Patient being monitored Patient Re-evaluated:Patient Re-evaluated prior to inductionOxygen Delivery Method: Circle system utilized Preoxygenation: Pre-oxygenation with 100% oxygen Intubation Type: IV induction Ventilation: Mask ventilation without difficulty LMA: LMA flexible inserted LMA Size: 3.0 Grade View: Grade II Placement Confirmation: positive ETCO2 and breath sounds checked- equal and bilateral Dental Injury: Teeth and Oropharynx as per pre-operative assessment

## 2016-04-30 NOTE — Transfer of Care (Signed)
Immediate Anesthesia Transfer of Care Note  Patient: Sheila Schneider  Procedure(s) Performed: Procedure(s): VULVAR LESION (N/A)  Patient Location: PACU  Anesthesia Type:General  Level of Consciousness: awake and sedated  Airway & Oxygen Therapy: Patient Spontanous Breathing and Patient connected to nasal cannula oxygen  Post-op Assessment: Report given to RN and Post -op Vital signs reviewed and stable  Post vital signs: Reviewed and stable  Last Vitals:  Vitals:   04/30/16 0610 04/30/16 0815  BP: 103/67 (P) 115/76  Pulse: 80 (P) 89  Resp: 16 (P) 14  Temp: 36.3 C (P) 36.9 C    Last Pain:  Vitals:   04/30/16 0610  TempSrc: Oral      Patients Stated Pain Goal: 3 (04/30/16 0610)  Complications: No apparent anesthesia complications

## 2016-05-01 ENCOUNTER — Encounter (HOSPITAL_COMMUNITY): Payer: Self-pay | Admitting: Obstetrics and Gynecology

## 2016-06-13 ENCOUNTER — Encounter (HOSPITAL_COMMUNITY): Payer: Self-pay | Admitting: Obstetrics and Gynecology

## 2016-06-13 NOTE — Anesthesia Postprocedure Evaluation (Signed)
Anesthesia Post Note  Patient: Edna Mlo Dyck  Procedure(s) Performed: Procedure(s) (LRB): VULVAR LESION (N/A)  Patient location during evaluation: PACU Anesthesia Type: MAC Level of consciousness: awake and alert Pain management: pain level controlled Vital Signs Assessment: post-procedure vital signs reviewed and stable Respiratory status: spontaneous breathing, nonlabored ventilation, respiratory function stable and patient connected to nasal cannula oxygen Cardiovascular status: blood pressure returned to baseline and stable Postop Assessment: no signs of nausea or vomiting Anesthetic complications: no       Last Vitals:  Vitals:   04/30/16 0900 04/30/16 0915  BP: 113/79 109/75  Pulse: 81 88  Resp: 14 18  Temp:      Last Pain:  Vitals:   05/01/16 1128  TempSrc:   PainSc: 1                  Chealsea Paske EDWARD

## 2016-08-01 LAB — OB RESULTS CONSOLE GC/CHLAMYDIA
CHLAMYDIA, DNA PROBE: NEGATIVE
Gonorrhea: NEGATIVE

## 2016-08-01 LAB — OB RESULTS CONSOLE RPR: RPR: NONREACTIVE

## 2016-08-01 LAB — OB RESULTS CONSOLE ABO/RH: RH TYPE: POSITIVE

## 2016-08-01 LAB — OB RESULTS CONSOLE ANTIBODY SCREEN: Antibody Screen: NEGATIVE

## 2016-08-01 LAB — OB RESULTS CONSOLE HIV ANTIBODY (ROUTINE TESTING): HIV: NONREACTIVE

## 2016-08-01 LAB — OB RESULTS CONSOLE RUBELLA ANTIBODY, IGM: Rubella: IMMUNE

## 2016-08-01 LAB — OB RESULTS CONSOLE HEPATITIS B SURFACE ANTIGEN: HEP B S AG: NEGATIVE

## 2016-08-31 NOTE — Anesthesia Postprocedure Evaluation (Signed)
Anesthesia Post Note  Patient: Sheila Schneider  Procedure(s) Performed: Procedure(s) (LRB): VULVAR LESION (N/A)     Anesthesia Post Evaluation  Last Vitals:  Vitals:   04/30/16 0900 04/30/16 0915  BP: 113/79 109/75  Pulse: 81 88  Resp: 14 18  Temp:      Last Pain:  Vitals:   05/01/16 1128  TempSrc:   PainSc: 1                  Thomos Domine EDWARD

## 2016-08-31 NOTE — Addendum Note (Signed)
Addendum  created 08/31/16 1452 by Idalie Canto, MD   Sign clinical note    

## 2017-01-23 LAB — OB RESULTS CONSOLE GBS: STREP GROUP B AG: POSITIVE

## 2017-02-05 ENCOUNTER — Telehealth (HOSPITAL_COMMUNITY): Payer: Self-pay | Admitting: *Deleted

## 2017-02-05 ENCOUNTER — Encounter (HOSPITAL_COMMUNITY): Payer: Self-pay | Admitting: *Deleted

## 2017-02-05 NOTE — Telephone Encounter (Signed)
Preadmission screen  

## 2017-02-06 ENCOUNTER — Telehealth (HOSPITAL_COMMUNITY): Payer: Self-pay | Admitting: *Deleted

## 2017-02-06 ENCOUNTER — Encounter (HOSPITAL_COMMUNITY): Payer: Self-pay | Admitting: *Deleted

## 2017-02-06 NOTE — Telephone Encounter (Signed)
Preadmission screen  

## 2017-02-15 ENCOUNTER — Inpatient Hospital Stay (HOSPITAL_COMMUNITY): Payer: BLUE CROSS/BLUE SHIELD

## 2017-02-15 NOTE — H&P (Deleted)
  The note originally documented on this encounter has been moved the the encounter in which it belongs.  

## 2017-02-15 NOTE — H&P (Signed)
Sheila Schneider is a 31 y.o. female G3P2002 at 5639 0/7 weeks (EDD 02/23/17 by 10 week US and unsure LMP)  presenting for IOL at term.  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   Pap LGSIL, will need f/u postpartum.  OB History    Gravida Para Term Preterm AB Living   3 2 2  0 0 2   SAB TAB Ectopic Multiple Live Births   0 0 0 0 2    NSVD x 2 2013 and 2015 ( both 7+lbs)  Past Medical History:  Diagnosis Date  . H/O burning pain in leg    right calf  . Hemoglobin E disease (HCC)    treat like SST in pregnancy  . History of ovarian cyst   . Lichen sclerosus    of vulva  . Right leg numbness    Past Surgical History:  Procedure Laterality Date  . NO PAST SURGERIES    . VULVAR LESION REMOVAL N/A 04/30/2016   Procedure: VULVAR LESION;  Surgeon: Lavina Hammanodd Meisinger, MD;  Location: [facility];  Service: Gynecology;  Laterality: N/A;  . WISDOM TOOTH EXTRACTION     Family History: family history is not on file. Social History:  reports that  has never smoked. she has never used smokeless tobacco. She reports that she drinks alcohol. She reports that she does not use drugs.     Maternal Diabetes: No Genetic Screening: Declined Maternal Ultrasounds/Referrals: Normal Fetal Ultrasounds or other Referrals:  None Maternal Substance Abuse:  No Significant Maternal Medications:  None Significant Maternal Lab Results:  Lab values include: Group B Strep positive Other Comments:  None  Review of Systems  Gastrointestinal: Negative for abdominal pain.  Neurological: Negative for headaches.   Maternal Medical History:  Contractions: Frequency: irregular.   Perceived severity is mild.    Fetal activity: Perceived fetal activity is normal.    Prenatal Complications - Diabetes: none.      Last menstrual period 04/24/2016, unknown if currently breastfeeding. Maternal Exam:   Uterine Assessment: Contraction strength is mild.  Contraction frequency is irregular.   Abdomen: Patient reports no abdominal tenderness. Fetal presentation: vertex  Introitus: Normal vulva. Normal vagina.    Physical Exam  Constitutional: She appears well-developed.  Cardiovascular: Normal rate and regular rhythm.  Respiratory: Effort normal.  GI: Soft.  Genitourinary: Vagina normal.  Neurological: She is alert.  Psychiatric: She has a normal mood and affect.    Prenatal labs: ABO, Rh: AB/Positive/-- (06/13 0000) Antibody: Negative (06/13 0000) Rubella: Immune (06/13 0000) RPR: Nonreactive (06/13 0000)  HBsAg: Negative (06/13 0000)  HIV: Non-reactive (06/13 0000)  GBS: Positive (12/05 0000)  One hour GCT 108 Declined genetic screening CF negative in prior pregnancy  Assessment/Plan: Pt for IOL at term.  Plan pitocin and AROM after PCN on board for +GBS   Oliver PilaKathy W Donisha Hoch 02/15/2017, 10:45 PM

## 2017-02-16 ENCOUNTER — Inpatient Hospital Stay (HOSPITAL_COMMUNITY): Payer: BLUE CROSS/BLUE SHIELD | Admitting: Anesthesiology

## 2017-02-16 ENCOUNTER — Inpatient Hospital Stay (HOSPITAL_COMMUNITY)
Admission: RE | Admit: 2017-02-16 | Discharge: 2017-02-16 | Disposition: A | Discharge status: Home / Self Care | Payer: BLUE CROSS/BLUE SHIELD | Source: Ambulatory Visit | Attending: Obstetrics and Gynecology | Admitting: Obstetrics and Gynecology

## 2017-02-16 ENCOUNTER — Encounter (HOSPITAL_COMMUNITY): Payer: Self-pay | Admitting: *Deleted

## 2017-02-16 ENCOUNTER — Inpatient Hospital Stay (HOSPITAL_COMMUNITY)
Admission: AD | Admit: 2017-02-16 | Discharge: 2017-02-18 | DRG: 807 | Disposition: A | Discharge status: Home / Self Care | Payer: BLUE CROSS/BLUE SHIELD | Source: Ambulatory Visit | Attending: Obstetrics and Gynecology | Admitting: Obstetrics and Gynecology

## 2017-02-16 DIAGNOSIS — O9902 Anemia complicating childbirth: Secondary | ICD-10-CM | POA: Diagnosis present

## 2017-02-16 DIAGNOSIS — Z3A39 39 weeks gestation of pregnancy: Secondary | ICD-10-CM

## 2017-02-16 DIAGNOSIS — D573 Sickle-cell trait: Secondary | ICD-10-CM | POA: Diagnosis present

## 2017-02-16 DIAGNOSIS — O26893 Other specified pregnancy related conditions, third trimester: Secondary | ICD-10-CM | POA: Diagnosis present

## 2017-02-16 DIAGNOSIS — O99824 Streptococcus B carrier state complicating childbirth: Secondary | ICD-10-CM | POA: Diagnosis present

## 2017-02-16 DIAGNOSIS — Z3490 Encounter for supervision of normal pregnancy, unspecified, unspecified trimester: Secondary | ICD-10-CM

## 2017-02-16 LAB — CBC
HEMATOCRIT: 30.6 % — AB (ref 36.0–46.0)
Hemoglobin: 10.6 g/dL — ABNORMAL LOW (ref 12.0–15.0)
MCH: 22.2 pg — ABNORMAL LOW (ref 26.0–34.0)
MCHC: 34.6 g/dL (ref 30.0–36.0)
MCV: 64.2 fL — AB (ref 78.0–100.0)
PLATELETS: 327 10*3/uL (ref 150–400)
RBC: 4.77 MIL/uL (ref 3.87–5.11)
RDW: 14.4 % (ref 11.5–15.5)
WBC: 6.9 10*3/uL (ref 4.0–10.5)

## 2017-02-16 LAB — TYPE AND SCREEN
ABO/RH(D): AB POS
Antibody Screen: NEGATIVE

## 2017-02-16 MED ORDER — TERBUTALINE SULFATE 1 MG/ML IJ SOLN
0.2500 mg | Freq: Once | INTRAMUSCULAR | Status: AC | PRN
  Administered 2017-02-17: 0.25 mg via SUBCUTANEOUS

## 2017-02-16 MED ORDER — PHENYLEPHRINE 40 MCG/ML (10ML) SYRINGE FOR IV PUSH (FOR BLOOD PRESSURE SUPPORT)
PREFILLED_SYRINGE | INTRAVENOUS | Status: AC
  Filled 2017-02-16: qty 20

## 2017-02-16 MED ORDER — PHENYLEPHRINE 40 MCG/ML (10ML) SYRINGE FOR IV PUSH (FOR BLOOD PRESSURE SUPPORT): 80.0000 ug | PREFILLED_SYRINGE | INTRAVENOUS | Status: DC | PRN

## 2017-02-16 MED ORDER — FENTANYL 2.5 MCG/ML BUPIVACAINE 1/10 % EPIDURAL INFUSION (WH - ANES)
14.0000 mL/h | INTRAMUSCULAR | Status: DC | PRN
  Administered 2017-02-16: 12 mL/h via EPIDURAL

## 2017-02-16 MED ORDER — PENICILLIN G POTASSIUM 5000000 UNITS IJ SOLR
5.0000 10*6.[IU] | Freq: Once | INTRAVENOUS | Status: AC
  Administered 2017-02-16: 5 10*6.[IU] via INTRAVENOUS
  Filled 2017-02-16: qty 5

## 2017-02-16 MED ORDER — DIPHENHYDRAMINE HCL 50 MG/ML IJ SOLN: 12.5000 mg | INTRAMUSCULAR | Status: DC | PRN

## 2017-02-16 MED ORDER — OXYTOCIN 40 UNITS IN LACTATED RINGERS INFUSION - SIMPLE MED: 2.5000 [IU]/h | INTRAVENOUS | Status: DC

## 2017-02-16 MED ORDER — SOD CITRATE-CITRIC ACID 500-334 MG/5ML PO SOLN
30.0000 mL | ORAL | Status: DC | PRN
  Filled 2017-02-16: qty 15

## 2017-02-16 MED ORDER — LACTATED RINGERS IV SOLN
500.0000 mL | Freq: Once | INTRAVENOUS | Status: AC
  Administered 2017-02-17: 500 mL via INTRAVENOUS

## 2017-02-16 MED ORDER — LIDOCAINE HCL (PF) 1 % IJ SOLN
30.0000 mL | INTRAMUSCULAR | Status: DC | PRN
  Filled 2017-02-16: qty 30

## 2017-02-16 MED ORDER — OXYCODONE-ACETAMINOPHEN 5-325 MG PO TABS: 2.0000 | ORAL_TABLET | ORAL | Status: DC | PRN

## 2017-02-16 MED ORDER — EPHEDRINE 5 MG/ML INJ: 10.0000 mg | INTRAVENOUS | Status: DC | PRN

## 2017-02-16 MED ORDER — OXYCODONE-ACETAMINOPHEN 5-325 MG PO TABS: 1.0000 | ORAL_TABLET | ORAL | Status: DC | PRN

## 2017-02-16 MED ORDER — OXYTOCIN 40 UNITS IN LACTATED RINGERS INFUSION - SIMPLE MED
1.0000 m[IU]/min | INTRAVENOUS | Status: DC
  Administered 2017-02-16: 2 m[IU]/min via INTRAVENOUS
  Filled 2017-02-16: qty 1000

## 2017-02-16 MED ORDER — LIDOCAINE HCL (PF) 1 % IJ SOLN
INTRAMUSCULAR | Status: DC | PRN
  Administered 2017-02-16: 5 mL via EPIDURAL
  Administered 2017-02-16: 2 mL via EPIDURAL
  Administered 2017-02-16: 3 mL via EPIDURAL

## 2017-02-16 MED ORDER — PHENYLEPHRINE 40 MCG/ML (10ML) SYRINGE FOR IV PUSH (FOR BLOOD PRESSURE SUPPORT)
80.0000 ug | PREFILLED_SYRINGE | INTRAVENOUS | Status: DC | PRN
  Administered 2017-02-16: 22:00:00 via INTRAVENOUS

## 2017-02-16 MED ORDER — LACTATED RINGERS IV SOLN: 500.0000 mL | Freq: Once | INTRAVENOUS | Status: DC

## 2017-02-16 MED ORDER — FENTANYL 2.5 MCG/ML BUPIVACAINE 1/10 % EPIDURAL INFUSION (WH - ANES)
INTRAMUSCULAR | Status: AC
  Filled 2017-02-16: qty 100

## 2017-02-16 MED ORDER — TERBUTALINE SULFATE 1 MG/ML IJ SOLN
0.2500 mg | Freq: Once | INTRAMUSCULAR | Status: DC | PRN
  Filled 2017-02-16: qty 1

## 2017-02-16 MED ORDER — ONDANSETRON HCL 4 MG/2ML IJ SOLN
4.0000 mg | Freq: Four times a day (QID) | INTRAMUSCULAR | Status: DC | PRN
  Administered 2017-02-17: 4 mg via INTRAVENOUS
  Filled 2017-02-16: qty 2

## 2017-02-16 MED ORDER — OXYTOCIN BOLUS FROM INFUSION
500.0000 mL | Freq: Once | INTRAVENOUS | Status: AC
  Administered 2017-02-17: 500 mL via INTRAVENOUS

## 2017-02-16 MED ORDER — ACETAMINOPHEN 325 MG PO TABS: 650.0000 mg | ORAL_TABLET | ORAL | Status: DC | PRN

## 2017-02-16 MED ORDER — LACTATED RINGERS IV SOLN
INTRAVENOUS | Status: DC
  Administered 2017-02-16 – 2017-02-17 (×3): via INTRAVENOUS

## 2017-02-16 MED ORDER — BUTORPHANOL TARTRATE 1 MG/ML IJ SOLN: 1.0000 mg | INTRAMUSCULAR | Status: DC | PRN

## 2017-02-16 MED ORDER — PENICILLIN G POT IN DEXTROSE 60000 UNIT/ML IV SOLN
3.0000 10*6.[IU] | INTRAVENOUS | Status: DC
  Administered 2017-02-16 – 2017-02-17 (×2): 3 10*6.[IU] via INTRAVENOUS
  Filled 2017-02-16 (×4): qty 50

## 2017-02-16 MED ORDER — LACTATED RINGERS IV SOLN
500.0000 mL | INTRAVENOUS | Status: DC | PRN
  Administered 2017-02-16: 200 mL via INTRAVENOUS
  Administered 2017-02-16: 500 mL via INTRAVENOUS

## 2017-02-16 NOTE — Progress Notes (Signed)
Patient ID: Sheila Schneider, female   DOB: 07/18/1985, 31 y.o.   MRN: [id number] Pt received epidural and comfortable afeb VSS FHR category 1  Cervix 80/3-4/-1   IUPC placed to adjust pitocin Pt on 4mu pitocin

## 2017-02-16 NOTE — Anesthesia Procedure Notes (Signed)
Epidural Patient location during procedure: OB Start time: 02/16/2017 10:04 PM End time: 02/16/2017 10:09 PM  Staffing Anesthesiologist: Cecile Hearingurk, Shirah Roseman Edward, MD Performed: anesthesiologist   Preanesthetic Checklist Completed: patient identified, pre-op evaluation, timeout performed, IV checked, risks and benefits discussed and monitors and equipment checked  Epidural Patient position: sitting Prep: DuraPrep Patient monitoring: blood pressure and continuous pulse ox Approach: midline Location: L3-L4 Injection technique: LOR air  Needle:  Needle type: Tuohy  Needle gauge: 17 G Needle length: 9 cm Needle insertion depth: 4 cm Catheter size: 19 Gauge Catheter at skin depth: 8 cm Test dose: negative and Other (1% Lidocaine)  Additional Notes Patient identified.  Risk benefits discussed including failed block, incomplete pain control, headache, nerve damage, paralysis, blood pressure changes, nausea, vomiting, reactions to medication both toxic or allergic, and postpartum back pain.  Patient expressed understanding and wished to proceed.  All questions were answered.  Sterile technique used throughout procedure and epidural site dressed with sterile barrier dressing. No paresthesia or other complications noted. The patient did not experience any signs of intravascular injection such as tinnitus or metallic taste in mouth nor signs of intrathecal spread such as rapid motor block. Please see nursing notes for vital signs. Reason for block:procedure for pain

## 2017-02-16 NOTE — Progress Notes (Signed)
Patient ID: Sheila Schneider, female   DOB: 07/14/1985, 31 y.o.   MRN: [id number] Pt finally admitted after delay of IOL due to busy Labor and Delivery.  She has been having irregular mild contractions, good FM  FHR category 1 afeb vss Cervix  2+/70/-2 AROM clear  PCN started for +GBS Will begin pitocin and increase as needed.

## 2017-02-16 NOTE — Anesthesia Preprocedure Evaluation (Signed)
Anesthesia Evaluation  Patient identified by MRN, date of birth, ID band Patient awake    Reviewed: Allergy & Precautions, NPO status , Patient's Chart, lab work & pertinent test results  Airway Mallampati: II  TM Distance: >3 FB Neck ROM: Full    Dental  (+) Teeth Intact, Dental Advisory Given   Pulmonary neg pulmonary ROS,    Pulmonary exam normal breath sounds clear to auscultation       Cardiovascular Exercise Tolerance: Good negative cardio ROS Normal cardiovascular exam Rhythm:Regular Rate:Normal     Neuro/Psych negative neurological ROS     GI/Hepatic negative GI ROS, Neg liver ROS,   Endo/Other  negative endocrine ROS  Renal/GU negative Renal ROS     Musculoskeletal negative musculoskeletal ROS (+)   Abdominal   Peds  Hematology  (+) Blood dyscrasia, anemia , Plt 327k  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  Reproductive/Obstetrics (+) Pregnancy                             Anesthesia Physical Anesthesia Plan  ASA: II  Anesthesia Plan: Epidural   Post-op Pain Management:    Induction:   PONV Risk Score and Plan: 2 and Treatment may vary due to age or medical condition  Airway Management Planned:   Additional Equipment:   Intra-op Plan:   Post-operative Plan:   Informed Consent: I have reviewed the patients History and Physical, chart, labs and discussed the procedure including the risks, benefits and alternatives for the proposed anesthesia with the patient or authorized representative who has indicated his/her understanding and acceptance.   Dental advisory given  Plan Discussed with:   Anesthesia Plan Comments: (Patient identified. Risks/Benefits/Options discussed with patient including but not limited to bleeding, infection, nerve  damage, paralysis, failed block, incomplete pain control, headache, blood pressure changes, nausea, vomiting, reactions to medication both or allergic, itching and postpartum back pain. Confirmed with bedside nurse the patient's most recent platelet count. Confirmed with patient that they are not currently taking any anticoagulation, have any bleeding history or any family history of bleeding disorders. Patient expressed understanding and wished to proceed. All questions were answered. )        Anesthesia Quick Evaluation

## 2017-02-16 NOTE — Anesthesia Pain Management Evaluation Note (Signed)
  CRNA Pain Management Visit Note  Patient: Sheila Schneider, 31 y.o., female  "Hello I am a member of the anesthesia team at [facility]. We have an anesthesia team available at all times to provide care throughout the hospital, including epidural management and anesthesia for C-section. I don't know your plan for the delivery whether it a natural birth, water birth, IV sedation, nitrous supplementation, doula or epidural, but we want to meet your pain goals."   1.Was your pain managed to your expectations on prior hospitalizations?   Yes   2.What is your expectation for pain management during this hospitalization?     Epidural  3.How can we help you reach that goal? Epidural when desired  Record the patient's initial score and the patient's pain goal.   Pain: 2  Pain Goal: 5 [facility] wants you to be able to say your pain was always managed very well.  [name] 02/16/2017

## 2017-02-17 ENCOUNTER — Encounter (HOSPITAL_COMMUNITY): Payer: Self-pay

## 2017-02-17 LAB — CBC
HEMATOCRIT: 28.1 % — AB (ref 36.0–46.0)
Hemoglobin: 10 g/dL — ABNORMAL LOW (ref 12.0–15.0)
MCH: 22.8 pg — AB (ref 26.0–34.0)
MCHC: 35.6 g/dL (ref 30.0–36.0)
MCV: 64 fL — ABNORMAL LOW (ref 78.0–100.0)
Platelets: 287 10*3/uL (ref 150–400)
RBC: 4.39 MIL/uL (ref 3.87–5.11)
RDW: 14.4 % (ref 11.5–15.5)
WBC: 17.6 10*3/uL — ABNORMAL HIGH (ref 4.0–10.5)

## 2017-02-17 LAB — RPR: RPR: NONREACTIVE

## 2017-02-17 MED ORDER — COCONUT OIL OIL
1.0000 "application " | TOPICAL_OIL | Status: DC | PRN
  Filled 2017-02-17: qty 120

## 2017-02-17 MED ORDER — ONDANSETRON HCL 4 MG/2ML IJ SOLN
4.0000 mg | INTRAMUSCULAR | Status: DC | PRN
  Administered 2017-02-17: 4 mg via INTRAVENOUS
  Filled 2017-02-17: qty 2

## 2017-02-17 MED ORDER — DIBUCAINE 1 % RE OINT
1.0000 "application " | TOPICAL_OINTMENT | RECTAL | Status: DC | PRN
  Filled 2017-02-17: qty 28

## 2017-02-17 MED ORDER — DIPHENHYDRAMINE HCL 25 MG PO CAPS: 25.0000 mg | ORAL_CAPSULE | Freq: Four times a day (QID) | ORAL | Status: DC | PRN

## 2017-02-17 MED ORDER — SIMETHICONE 80 MG PO CHEW: 80.0000 mg | CHEWABLE_TABLET | ORAL | Status: DC | PRN

## 2017-02-17 MED ORDER — IBUPROFEN 600 MG PO TABS
600.0000 mg | ORAL_TABLET | Freq: Four times a day (QID) | ORAL | Status: DC
  Administered 2017-02-17 – 2017-02-18 (×5): 600 mg via ORAL
  Filled 2017-02-17 (×5): qty 1

## 2017-02-17 MED ORDER — TETANUS-DIPHTH-ACELL PERTUSSIS 5-2.5-18.5 LF-MCG/0.5 IM SUSP
0.5000 mL | Freq: Once | INTRAMUSCULAR | Status: AC
  Administered 2017-02-18: 0.5 mL via INTRAMUSCULAR
  Filled 2017-02-17 (×2): qty 0.5

## 2017-02-17 MED ORDER — ACETAMINOPHEN 325 MG PO TABS: 650.0000 mg | ORAL_TABLET | ORAL | Status: DC | PRN

## 2017-02-17 MED ORDER — SENNOSIDES-DOCUSATE SODIUM 8.6-50 MG PO TABS
2.0000 | ORAL_TABLET | ORAL | Status: DC
  Administered 2017-02-17 – 2017-02-18 (×2): 2 via ORAL
  Filled 2017-02-17 (×2): qty 2

## 2017-02-17 MED ORDER — ONDANSETRON HCL 4 MG PO TABS: 4.0000 mg | ORAL_TABLET | ORAL | Status: DC | PRN

## 2017-02-17 MED ORDER — BENZOCAINE-MENTHOL 20-0.5 % EX AERO
1.0000 "application " | INHALATION_SPRAY | CUTANEOUS | Status: DC | PRN
  Administered 2017-02-17: 1 via TOPICAL
  Filled 2017-02-17 (×2): qty 56

## 2017-02-17 MED ORDER — WITCH HAZEL-GLYCERIN EX PADS: 1.0000 "application " | MEDICATED_PAD | CUTANEOUS | Status: DC | PRN

## 2017-02-17 MED ORDER — ZOLPIDEM TARTRATE 5 MG PO TABS: 5.0000 mg | ORAL_TABLET | Freq: Every evening | ORAL | Status: DC | PRN

## 2017-02-17 MED ORDER — PRENATAL MULTIVITAMIN CH
1.0000 | ORAL_TABLET | Freq: Every day | ORAL | Status: DC
  Administered 2017-02-17 – 2017-02-18 (×2): 1 via ORAL
  Filled 2017-02-17 (×2): qty 1

## 2017-02-17 MED ORDER — LACTATED RINGERS IV SOLN
INTRAVENOUS | Status: DC
  Administered 2017-02-17: 03:00:00 via INTRAUTERINE

## 2017-02-17 NOTE — Progress Notes (Signed)
Patient ID: Sheila Schneider, female   DOB: 08/08/1985, 31 y.o.   MRN: [id number] Pt doing well in postpartum room.  Bleeding WNL  afeb VSS Fundus firm  Pt states plans circumcision in office, still needs to call and pay

## 2017-02-17 NOTE — Progress Notes (Signed)
Patient ID: Sheila Schneider, female   DOB: 08/14/1985, 31 y.o.   MRN: [id number] CTSP for prolonged deceleration  Pt noted to have intermittent variable decels reported by RN so instructed to place FSE, with FSE FHR overall with good variability, accels and occasional variable decels  FHR dropped to 60 and took about 7-8 minutes to return to baseline with pitocin off, terbutaline, O2 and multiple position changes. Decel occurred after a short period of tachysystole and resolved when contractions spaced out   Cervix c/9+/0 Cervix stretches to complete, but baby OP  Tried 2 pushes to see if would help spontaneously rotate vertex, but did not   Pt placed in exaggerated Sims and peanut placed.  Contractions not currently adequate Will see if contractions pick back up on their own as terbutaline wears off, if not will restart pitocin very slowly (was at 6mu prior to decel)

## 2017-02-17 NOTE — Progress Notes (Signed)
Patient ID: Sheila Schneider, female   DOB: 01/23/1986, 31 y.o.   MRN: [id number] After prolonged deceleration FHR returned to category 1, but contractions remained inadequate after one hour and there was no cervical change  afeb VSS FHR now with good variability but intermittent variable decels as contractions improve  Will restart pitocin slowly at 1 mu and increase by 1mu Amnioinfusion for variable decels

## 2017-02-17 NOTE — Anesthesia Postprocedure Evaluation (Signed)
Anesthesia Post Note  Patient: Sheila Schneider  Procedure(s) Performed: AN AD HOC LABOR EPIDURAL     Patient location during evaluation: Mother Baby Anesthesia Type: Epidural Level of consciousness: awake Pain management: satisfactory to patient Vital Signs Assessment: post-procedure vital signs reviewed and stable Respiratory status: spontaneous breathing Cardiovascular status: stable Anesthetic complications: no    Last Vitals:  Vitals:   02/17/17 0805 02/17/17 1245  BP: (!) 103/57 (!) 107/54  Pulse: 83 84  Resp: 16 16  Temp: 36.6 C 37.2 C  SpO2: 98% 100%    Last Pain:  Vitals:   02/17/17 1245  TempSrc: Axillary  PainSc: 3    Pain Goal: Patients Stated Pain Goal: 2 (02/17/17 1245)               Cephus ShellingBURGER,Keane Martelli

## 2017-02-18 NOTE — Discharge Instructions (Signed)
Nothing in vagina for 6 weeks.  No sex, tampons, and douching.  Other instructions as in [facility] Discharge Booklet. °

## 2017-02-18 NOTE — Lactation Note (Signed)
This note was copied from a baby's chart. Lactation Consultation Note  Patient Name: Sheila Delton Seeanhia Harlacher ONGEX'BToday's Date: 02/18/2017 Reason for consult: Initial assessment;Term Breastfeeding consultation services and support information given to patient.  This is her third baby and she breastfed her previous babies without difficulty.  Newborn is 7830 hours old and mom reports baby is latching well.  No concerns at present time.  Instructed to feed with any cue and call for assist prn.  Outpatient services encouraged.  Maternal Data Has patient been taught Hand Expression?: Yes Does the patient have breastfeeding experience prior to this delivery?: Yes  Feeding    LATCH Score                   Interventions    Lactation Tools Discussed/Used     Consult Status Consult Status: Complete    [name], [name] 02/18/2017, 9:39 AM

## 2017-02-18 NOTE — Discharge Summary (Signed)
OB Discharge Summary     Patient Name: Sheila Schneider DOB: 12/21/1985 MRN: [id number]  Date of admission: 02/16/2017 Delivering MD: Huel CoteICHARDSON, KATHY   Date of discharge: 02/18/2017  Admitting diagnosis: INDUCTION Intrauterine pregnancy: 3949w1d     Secondary diagnosis:  Active Problems:   Term pregnancy   NSVD (normal spontaneous vaginal delivery)  Additional problems: none     Discharge diagnosis: Term Pregnancy Delivered                                                                                                Post partum procedures:none  Augmentation: AROM and Pitocin  Complications: None  Hospital course:  Induction of Labor With Vaginal Delivery   31 y.o. yo G3P3003 at 7549w1d was admitted to the hospital 02/16/2017 for induction of labor.  Indication for induction: Favorable cervix at term.  Patient had an uncomplicated labor course as follows: Membrane Rupture Time/Date: 7:52 PM ,02/16/2017   Intrapartum Procedures: Episiotomy:                                           Lacerations:  1st degree [2];Labial [10]  Patient had delivery of a Viable infant.  Information for the patient's newborn:  Sheila Schneider, Boy Sheila Schneider [[id number]][[id number]]  Delivery Method: Vaginal, Spontaneous(Filed from Delivery Summary)   02/17/2017  Details of delivery can be found in separate delivery note.  Patient had a routine postpartum course. Patient is discharged home 02/18/17.  Physical exam  Vitals:   02/17/17 0705 02/17/17 0805 02/17/17 1245 02/18/17 0458  BP: (!) 104/47 (!) 103/57 (!) 107/54 (!) 98/50  Pulse: 79 83 84 85  Resp: 16 16 16 20   Temp: 99 F (37.2 C) 97.9 F (36.6 C) 99 F (37.2 C) 97.7 F (36.5 C)  TempSrc: Oral Axillary Axillary Oral  SpO2: 100% 98% 100%   Weight:      Height:       General: alert, cooperative and no distress Lochia: appropriate Uterine Fundus: firm Incision: N/A DVT Evaluation: No evidence of DVT seen on physical exam. Labs: Lab Results  Component  Value Date   WBC 17.6 (H) 02/17/2017   HGB 10.0 (L) 02/17/2017   HCT 28.1 (L) 02/17/2017   MCV 64.0 (L) 02/17/2017   PLT 287 02/17/2017   CMP Latest Ref Rng & Units 04/11/2010  Glucose 70 - 99 mg/dL 96  BUN 6 - 23 mg/dL 5(L)  Creatinine 0.4 - 1.2 mg/dL 7.820.47  Sodium 956135 - 213145 mEq/L 136  Potassium 3.5 - 5.1 mEq/L 3.4(L)  Chloride 96 - 112 mEq/L 102  CO2 19 - 32 mEq/L 29  Calcium 8.4 - 10.5 mg/dL 9.2    Discharge instruction: per After Visit Summary and "Baby and Me Booklet".  After visit meds:  Allergies as of 02/18/2017   No Known Allergies     Medication List    TAKE these medications   IRON PO Take 2 tablets by mouth daily.   prenatal multivitamin Tabs tablet Take 1  tablet by mouth daily at 12 noon.       Diet: routine diet  Activity: Advance as tolerated. Pelvic rest for 6 weeks.   Outpatient follow up:6 weeks Follow up Appt:No future appointments. Follow up Visit:No Follow-up on file.  Postpartum contraception: Not Discussed  Newborn Data: Live born female  Birth Weight: 6 lb 13.7 oz (3110 g) APGAR: 8, 9  Newborn Delivery   Birth date/time:  02/17/2017 03:34:00 Delivery type:  Vaginal, Spontaneous     Baby Feeding: Breast Disposition:home with mother   02/18/2017 Sheila Musterecilia W Raisha Brabender, DO

## 2017-02-18 NOTE — Progress Notes (Signed)
Patient ID: Sheila Schneider, female   DOB: 03/16/1985, 31 y.o.   MRN: [id number] Late entry Pt doing well with no complaints. Bonding well with baby breastfeeding. Lochia mild. No fever or chills or CP. Ambulating and tolerating diet well. Requests discharge to home today.  VSS ABD - FF EXT - no homans  17.6>10<287  A/P: PPD#1 sp svd - stable         Discharge instructions reviewed         Call for postpartum and circ appts
